# Patient Record
Sex: Female | Born: 1960 | Race: White | Hispanic: No | Marital: Married | Smoking: Former smoker
Health system: Southern US, Community
[De-identification: ages and names within clinical notes are randomized; demographics above are authoritative.]

## PROBLEM LIST (undated history)

## (undated) DIAGNOSIS — F909 Attention-deficit hyperactivity disorder, unspecified type: Secondary | ICD-10-CM

## (undated) DIAGNOSIS — D6851 Activated protein C resistance: Secondary | ICD-10-CM

## (undated) DIAGNOSIS — C801 Malignant (primary) neoplasm, unspecified: Secondary | ICD-10-CM

## (undated) DIAGNOSIS — N393 Stress incontinence (female) (male): Secondary | ICD-10-CM

## (undated) DIAGNOSIS — Z5189 Encounter for other specified aftercare: Secondary | ICD-10-CM

## (undated) DIAGNOSIS — R42 Dizziness and giddiness: Secondary | ICD-10-CM

## (undated) DIAGNOSIS — I82409 Acute embolism and thrombosis of unspecified deep veins of unspecified lower extremity: Secondary | ICD-10-CM

## (undated) DIAGNOSIS — D759 Disease of blood and blood-forming organs, unspecified: Secondary | ICD-10-CM

## (undated) HISTORY — DX: Activated protein C resistance: D68.51

## (undated) HISTORY — DX: Acute embolism and thrombosis of unspecified deep veins of unspecified lower extremity: I82.409

## (undated) HISTORY — DX: Encounter for other specified aftercare: Z51.89

## (undated) HISTORY — DX: Stress incontinence (female) (male): N39.3

## (undated) HISTORY — DX: Dizziness and giddiness: R42

---

## 1989-09-30 HISTORY — PX: TUBAL LIGATION: SHX77

## 1997-07-07 ENCOUNTER — Emergency Department (HOSPITAL_COMMUNITY): Admission: EM | Admit: 1997-07-07 | Discharge: 1997-07-07 | Payer: Self-pay | Admitting: Emergency Medicine

## 1997-07-08 ENCOUNTER — Emergency Department (HOSPITAL_COMMUNITY): Admission: EM | Admit: 1997-07-08 | Discharge: 1997-07-08 | Payer: Self-pay | Admitting: Emergency Medicine

## 1998-08-16 ENCOUNTER — Ambulatory Visit (HOSPITAL_COMMUNITY): Admission: RE | Admit: 1998-08-16 | Discharge: 1998-08-16 | Payer: Self-pay | Admitting: Family Medicine

## 1999-05-21 ENCOUNTER — Other Ambulatory Visit: Admission: RE | Admit: 1999-05-21 | Discharge: 1999-05-21 | Payer: Self-pay | Admitting: Obstetrics and Gynecology

## 1999-06-26 ENCOUNTER — Ambulatory Visit (HOSPITAL_COMMUNITY): Admission: RE | Admit: 1999-06-26 | Discharge: 1999-06-26 | Payer: Self-pay | Admitting: Emergency Medicine

## 1999-07-10 ENCOUNTER — Other Ambulatory Visit: Admission: RE | Admit: 1999-07-10 | Discharge: 1999-07-10 | Payer: Self-pay | Admitting: Obstetrics and Gynecology

## 1999-07-30 ENCOUNTER — Ambulatory Visit (HOSPITAL_COMMUNITY): Admission: RE | Admit: 1999-07-30 | Discharge: 1999-07-30 | Payer: Self-pay | Admitting: Obstetrics and Gynecology

## 2000-09-19 ENCOUNTER — Emergency Department (HOSPITAL_COMMUNITY): Admission: EM | Admit: 2000-09-19 | Discharge: 2000-09-19 | Payer: Self-pay

## 2001-03-10 ENCOUNTER — Ambulatory Visit (HOSPITAL_COMMUNITY): Admission: RE | Admit: 2001-03-10 | Discharge: 2001-03-10 | Payer: Self-pay | Admitting: Emergency Medicine

## 2009-03-09 HISTORY — PX: LAPAROSCOPIC GASTRIC SLEEVE RESECTION: SHX5895

## 2014-03-09 DIAGNOSIS — R638 Other symptoms and signs concerning food and fluid intake: Secondary | ICD-10-CM | POA: Insufficient documentation

## 2014-09-26 ENCOUNTER — Telehealth: Payer: Self-pay | Admitting: Nurse Practitioner

## 2014-09-26 NOTE — Telephone Encounter (Signed)
Dr. 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 She is requesting first available apt and would like to inform patient soon. Schedule reflects possible apt 09/28/14 at 8:30. MD to inform patient of apt with the condition that this is a tentative time and our office will call the patient to confirm.  After discussing with Joylene John, NP, patient scheduled 10/01/14 at 3:30 with Dr. Denman George. RN requested that Dr. Rogue Bussing please send path and pertinent records.  She confirms the above.   No answer at phone number given by Dr. Rogue Bussing; our office to f/u with patient to confirm apt date/time/location.

## 2014-10-01 ENCOUNTER — Ambulatory Visit: Payer: 59 | Attending: Gynecologic Oncology | Admitting: Gynecologic Oncology

## 2014-10-01 ENCOUNTER — Encounter: Payer: Self-pay | Admitting: Gynecologic Oncology

## 2014-10-01 VITALS — BP 118/72 | HR 86 | Temp 98.1°F | Resp 20 | Ht 68.0 in | Wt 257.3 lb

## 2014-10-01 DIAGNOSIS — C541 Malignant neoplasm of endometrium: Secondary | ICD-10-CM | POA: Insufficient documentation

## 2014-10-01 NOTE — Patient Instructions (Signed)
Preparing for your Surgery  Plan for surgery on August 30 or possibly August 23 if cancellation with Dr. Everitt Amber.  Plan for robotic assisted total hysterectomy, bilateral salpingo-oophorectomy, and sentinel lymph node biopsy.  Pre-operative Testing -You will receive a phone call from presurgical testing at [facility] to arrange for a pre-operative testing appointment before your surgery.  This appointment normally occurs one to two weeks before your scheduled surgery.   -Bring your insurance card, copy of an advanced directive if applicable, medication list  -At that visit, you will be asked to sign a consent for a possible blood transfusion in case a transfusion becomes necessary during surgery.  The need for a blood transfusion is rare but having consent is a necessary part of your care.     -You should not be taking blood thinners or aspirin at least ten days prior to surgery unless instructed by your surgeon.  Day Before Surgery at [facility] will be asked to take in only clear liquids the day before surgery.  Examples of clear liquids include broths, jello, and clear juices.  Avoid carbonated beverages.  You may also be advised to perform a Miralax bowel prep or fleets enema the night before your surgery based off of your provider's recommendations.  You will be advised to have nothing to eat or drink after midnight the evening before.    Your role in recovery Your role is to become active as soon as directed by your doctor, while still giving yourself time to heal.  Rest when you feel tired. You will be asked to do the following in order to speed your recovery:  - Cough and breathe deeply. This helps toclear and expand your lungs and can prevent pneumonia. You may be given a spirometer to practice deep breathing. A staff member will show you how to use the spirometer. - Do mild physical activity. Walking or moving your legs help your circulation and body functions  return to normal. A staff member will help you when you try to walk and will provide you with simple exercises. Do not try to get up or walk alone the first time. - Actively manage your pain. Managing your pain lets you move in comfort. We will ask you to rate your pain on a scale of zero to 10. It is your responsibility to tell your doctor or nurse where and how much you hurt so your pain can be treated.  Special Considerations -If you are diabetic, you may be placed on insulin after surgery to have closer control over your blood sugars to promote healing and recovery.  This does not mean that you will be discharged on insulin.  If applicable, your oral antidiabetics will be resumed when you are tolerating a solid diet.  -Your final pathology results from surgery should be available by the Friday after surgery and the results will be relayed to you when available.  Blood Transfusion Information WHAT IS A BLOOD TRANSFUSION? A transfusion is the replacement of blood or some of its parts. Blood is made up of multiple cells which provide different functions.  Red blood cells carry oxygen and are used for blood loss replacement.  White blood cells fight against infection.  Platelets control bleeding.  Plasma helps clot blood.  Other blood products are available for specialized needs, such as hemophilia or other clotting disorders. BEFORE THE TRANSFUSION  Who gives blood for transfusions?   You may be able to donate blood to be used  at a later date on yourself (autologous donation).  Relatives can be asked to donate blood. This is generally not any safer than if you have received blood from a stranger. The same precautions are taken to ensure safety when a relative's blood is donated.  Healthy volunteers who are fully evaluated to make sure their blood is safe. This is blood bank blood. Transfusion therapy is the safest it has ever been in the practice of medicine. Before blood is taken from  a donor, a complete history is taken to make sure that person has no history of diseases nor engages in risky social behavior (examples are intravenous drug use or sexual activity with multiple partners). The donor's travel history is screened to minimize risk of transmitting infections, such as malaria. The donated blood is tested for signs of infectious diseases, such as HIV and hepatitis. The blood is then tested to be sure it is compatible with you in order to minimize the chance of a transfusion reaction. If you or a relative donates blood, this is often done in anticipation of surgery and is not appropriate for emergency situations. It takes many days to process the donated blood. RISKS AND COMPLICATIONS Although transfusion therapy is very safe and saves many lives, the main dangers of transfusion include:   Getting an infectious disease.  Developing a transfusion reaction. This is an allergic reaction to something in the blood you were given. Every precaution is taken to prevent this. The decision to have a blood transfusion has been considered carefully by your caregiver before blood is given. Blood is not given unless the benefits outweigh the risks.

## 2014-10-01 NOTE — Progress Notes (Signed)
Consult Note: Gyn-Onc  Consult was requested by Dr. Rogue Bussing for the evaluation of Emmelyn Schmale 54 y.o. female with grade 1 endometrial cancer  CC:  Chief Complaint  Patient presents with  . endometrial cancer    New consult    Assessment/Plan:  Ms. Tailynn Armetta  is a 54 y.o.  year old with grade 1 endometrial cancer.  A detailed discussion was held with the patient and her family with regard to to her endometrial cancer diagnosis. We discussed the standard management options for uterine cancer which includes surgery followed possibly by adjuvant therapy depending on the results of surgery. The options for surgical management include a hysterectomy and removal of the tubes and ovaries possibly with removal of pelvic and para-aortic lymph nodes. A minimally invasive approach including a robotic hysterectomy or laparoscopic hysterectomy have benefits including shorter hospital stay, recovery time and better wound healing. The alternative approach is an open hysterectomy. The patient has been counseled about these surgical options and the risks of surgery in general including infection, bleeding, damage to surrounding structures (including bowel, bladder, ureters, nerves or vessels), and the postoperative risks of PE/ DVT, and lymphedema. I extensively reviewed the additional risks of robotic hysterectomy including possible need for conversion to open laparotomy.  I discussed positioning during surgery of trendelenberg and risks of minor facial swelling and care we take in preoperative positioning.  After counseling and consideration of her options, she desires to proceed with robotic hysterectomy, BSO and sentinel lymph node biospy. I counseled the patient about the 3% false negative rate of SLN biopsy, however, a procedure that spares lymphadenectomy and minimizes the risk for lymphedema is important in this patient with a history of chronic LE venous insufficiency from prior DVT.  Because of her  factor V leiden mutation she is at increased risk for VTE. She will receive preoperative dosing of lovenox and 28 days of postop lovenox to mitigate this risk, though that is associated with an increased risk of postop bleeding.   She will be seen by anesthesia for preoperative clearance and discussion of postoperative pain management.  She was given the opportunity to ask questions, which were answered to her satisfaction, and she is agreement with the above mentioned plan of care.   HPI: The patient is a 54 year old G3P3 who is seen in consultation at the request of Dr Rogue Bussing for grade 1 endometrial cancer. The patient has been postmenopausal for several years but has begun experiencing intermittent vaginal spotting. She saw Dr Rogue Bussing who performed TV US on 09/24/2014 which revealed a normal size uterus measuring 7 x 5.3 x 4.6 cm with a thickened endometrial stripe measuring 25 mm. The ovaries bilaterally were normal. 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 A Pap smear performed on June 2016 revealed atypical glandular cells.  The patient is overall healthy woman. She most significantly has a history of factor V Leyden mutation and a remote history of a left lower extremity DVT in 1996. She thinks that she might had a prior DVT in 1991 as well. At that time she was treated with Coumadin however has had no further VTE events and is now only taking low-dose aspirin for prophylaxis. 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  She is morbidly obese with a BMI of 39 kg/m.  Her prior abdominal surgeries include 3 prior cesarean sections. She is no family history concerning for GYN malignancy will Lynch syndrome.   Current Meds:  Outpatient Encounter Prescriptions as of  10/01/2014  Medication Sig  . amphetamine-dextroamphetamine (ADDERALL XR) 30 MG 24 hr capsule Take 30 mg by mouth 2 (two) times daily.   Marland Kitchen ibuprofen (ADVIL,MOTRIN) 200 MG tablet Take 200 mg by mouth as needed.  Marland Kitchen aspirin 81 MG tablet Take 81 mg by mouth 2 (two) times daily.  . [DISCONTINUED] methylphenidate 36 MG PO CR tablet    No facility-administered encounter medications on file as of 10/01/2014.    Allergy: No Known Allergies  Social Hx:   History   Social History  . Marital Status: Married    Spouse Name: N/A  . Number of Children: N/A  . Years of Education: N/A   Occupational History  . Not on file.   Social History Main Topics  . Smoking status: Former Smoker    Quit date: 03/10/1995  . Smokeless tobacco: Not on file  . Alcohol Use: No  . Drug Use: No  . Sexual Activity: Not on file   Other Topics Concern  . Not on file   Social History Narrative  . No narrative on file    Past Surgical Hx:  Past Surgical History  Procedure Laterality Date  . Tubal ligation  09/30/1989  . Cesarean section  1987, 1989, 1991    Past Medical Hx:  Past Medical History  Diagnosis Date  . DVT (deep venous thrombosis)   . Factor V Leiden mutation   . Stress incontinence     Past Gynecological History:  C/s x3. AGUS pap in July 2016.  No LMP recorded.  Family Hx:  Family History  Problem Relation Age of Onset  . Diabetes Mother   . Hypertension Mother   . Congestive Heart Failure Mother   . Diabetes Brother   . Heart disease Brother   . Kidney failure Brother   . Cancer Maternal Aunt   . Diabetes Paternal Grandmother     Review of Systems:  Constitutional  Feels well,    ENT Normal appearing ears and nares bilaterally Skin/Breast  No rash, sores, jaundice, itching, dryness Cardiovascular  No chest pain, shortness of breath, or edema  Pulmonary  No cough or wheeze.  Gastro Intestinal  No nausea, vomitting, or diarrhoea. No bright red blood per rectum, no  abdominal pain, change in bowel movement, or constipation.  Genito Urinary  No frequency, urgency, dysuria, see HPI Musculo Skeletal  No myalgia, arthralgia, joint swelling or pain  Neurologic  No weakness, numbness, change in gait,  Psychology  No depression, anxiety, insomnia.   Vitals:  Blood pressure 118/72, pulse 86, temperature 98.1 F (36.7 C), temperature source Oral, resp. rate 20, height 5\' 8"  (1.727 m), weight 257 lb 4.8 oz (116.711 kg), SpO2 100 %.  Physical Exam: WD in NAD Neck  Supple NROM, without any enlargements.  Lymph Node Survey No cervical supraclavicular or inguinal adenopathy Cardiovascular  Pulse normal rate, regularity and rhythm. S1 and S2 normal.  Lungs  Clear to auscultation bilateraly, without wheezes/crackles/rhonchi. Good air movement.  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 Psychiatry  Alert and oriented to person, place, and time  Abdomen  Normoactive bowel sounds, abdomen soft, non-tender and obese without evidence of hernia.  Back No CVA tenderness Genito Urinary  Vulva/vagina: Normal external female genitalia.  No  lesions. No discharge or bleeding.  Bladder/urethra:  No lesions or masses, well supported bladder  Vagina: normal  Cervix: Normal appearing, no lesions.  Uterus:  Small, mobile, no parametrial involvement or nodularity.  Adnexa: no palpable masses. Rectal  Good tone, no masses no cul de sac nodularity.  Extremities  No bilateral cyanosis, clubbing or edema.  Donaciano Eva, MD  10/01/2014, 4:55 PM

## 2014-10-11 ENCOUNTER — Telehealth: Payer: Self-pay | Admitting: Gynecologic Oncology

## 2014-10-11 NOTE — Telephone Encounter (Signed)
Spoke with patient about potential opening for surgery next week and to see if she would like to move her surgery up to next week from August 30.  Stating she is having her floors redone next week and feels that she would have to stay on the 30th or if we get another opening, she would like that date.  Advised she would be contacted if another opening arose.  Advised to call for any questions or concerns.

## 2014-10-19 ENCOUNTER — Telehealth: Payer: Self-pay | Admitting: *Deleted

## 2014-10-19 NOTE — Telephone Encounter (Signed)
Spoke with patient and she states she would like to postpone surgery currently scheduled on 10/23/14 to either 8/23 or 8/30 as her grandson's first birthday party is scheduled for 10/27/14. Told patient that currently there are no open surgery slots for 8/23 so her surgery will be on 8/30 as previously planned. Patient states understanding and has no other concerns at this time.

## 2014-10-29 NOTE — Patient Instructions (Signed)
Peggy Freeman  10/29/2014   Your procedure is scheduled on: Tuesday 11/06/2014  Report to [facility] Main  Entrance take [place]  elevators to 3rd floor to  [place] at  [place] AM.  Call this number if you have problems the morning of surgery [phone]   Remember: ONLY 1 PERSON MAY GO WITH YOU TO SHORT STAY TO GET  READY MORNING OF [place].              FOLLOW A CLEAR LIQUID DIET ALL DAY THE DAY BEFORE SURGERY -ON Monday 11/05/2014! (SEE LIST BELOW)  Do not eat food or drink liquids :After Midnight.     Take these medicines the morning of surgery with A SIP OF WATER: NONE                               You may not have any metal on your body including hair pins and              piercings  Do not wear jewelry, make-up, lotions, powders or perfumes, deodorant             Do not wear nail polish.  Do not shave  48 hours prior to surgery.              Men may shave face and neck.   Do not bring valuables to the hospital. [place].  Contacts, dentures or bridgework may not be worn into surgery.  Leave suitcase in the car. After surgery it may be brought to your room.     Patients discharged the day of surgery will not be allowed to drive home.  Name and phone number of your driver:  Special Instructions: N/A              Please read over the following fact sheets you were given: _____________________________________________________________________             [facility] - Preparing for Surgery Before surgery, you can play an important role.  Because skin is not sterile, your skin needs to be as free of germs as possible.  You can reduce the number of germs on your skin by washing with CHG (chlorahexidine gluconate) soap before surgery.  CHG is an antiseptic cleaner which kills germs and bonds with the skin to continue killing germs even after washing. Please DO NOT use if you have an allergy to CHG  or antibacterial soaps.  If your skin becomes reddened/irritated stop using the CHG and inform your nurse when you arrive at Short Stay. Do not shave (including legs and underarms) for at least 48 hours prior to the first CHG shower.  You may shave your face/neck. Please follow these instructions carefully:  1.  Shower with CHG Soap the night before surgery and the  morning of Surgery.  2.  If you choose to wash your hair, wash your hair first as usual with your  normal  shampoo.  3.  After you shampoo, rinse your hair and body thoroughly to remove the  shampoo.                           4.  Use CHG as you  would any other liquid soap.  You can apply chg directly  to the skin and wash                       Gently with a scrungie or clean washcloth.  5.  Apply the CHG Soap to your body ONLY FROM THE NECK DOWN.   Do not use on face/ open                           Wound or open sores. Avoid contact with eyes, ears mouth and genitals (private parts).                       Wash face,  Genitals (private parts) with your normal soap.             6.  Wash thoroughly, paying special attention to the area where your surgery  will be performed.  7.  Thoroughly rinse your body with warm water from the neck down.  8.  DO NOT shower/wash with your normal soap after using and rinsing off  the CHG Soap.                9.  Pat yourself dry with a clean towel.            10.  Wear clean pajamas.            11.  Place clean sheets on your bed the night of your first shower and do not  sleep with pets. Day of Surgery : Do not apply any lotions/deodorants the morning of surgery.  Please wear clean clothes to the hospital/surgery center.  FAILURE TO FOLLOW THESE INSTRUCTIONS MAY RESULT IN THE CANCELLATION OF YOUR SURGERY PATIENT SIGNATURE_________________________________  NURSE SIGNATURE__________________________________  ________________________________________________________________________   Adam Phenix  An incentive spirometer is a tool that can help keep your lungs clear and active. This tool measures how well you are filling your lungs with each breath. Taking long deep breaths may help reverse or decrease the chance of developing breathing (pulmonary) problems (especially infection) following:  A long period of time when you are unable to move or be active. BEFORE THE PROCEDURE   If the spirometer includes an indicator to show your best effort, your nurse or respiratory therapist will set it to a desired goal.  If possible, sit up straight or lean slightly forward. Try not to slouch.  Hold the incentive spirometer in an upright position. INSTRUCTIONS FOR USE   Sit on the edge of your bed if possible, or sit up as far as you can in bed or on a chair.  Hold the incentive spirometer in an upright position.  Breathe out normally.  Place the mouthpiece in your mouth and seal your lips tightly around it.  Breathe in slowly and as deeply as possible, raising the piston or the ball toward the top of the column.  Hold your breath for 3-5 seconds or for as long as possible. Allow the piston or ball to fall to the bottom of the column.  Remove the mouthpiece from your mouth and breathe out normally.  Rest for a few seconds and repeat Steps 1 through 7 at least 10 times every 1-2 hours when you are awake. Take your time and take a few normal breaths between deep breaths.  The spirometer may include an indicator to show your best effort. Use the indicator  as a goal to work toward during each repetition.  After each set of 10 deep breaths, practice coughing to be sure your lungs are clear. If you have an incision (the cut made at the time of surgery), support your incision when coughing by placing a pillow or rolled up towels firmly against it. Once you are able to get out of bed, walk around indoors and cough well. You may stop using the incentive spirometer when instructed by  your caregiver.  RISKS AND COMPLICATIONS  Take your time so you do not get dizzy or light-headed.  If you are in pain, you may need to take or ask for pain medication before doing incentive spirometry. It is harder to take a deep breath if you are having pain. AFTER USE  Rest and breathe slowly and easily.  It can be helpful to keep track of a log of your progress. Your caregiver can provide you with a simple table to help with this. If you are using the spirometer at home, follow these instructions: [place] IF:   You are having difficultly using the spirometer.  You have trouble using the spirometer as often as instructed.  Your pain medication is not giving enough relief while using the spirometer.  You develop fever of 100.5 F (38.1 C) or higher. SEEK IMMEDIATE MEDICAL CARE IF:   You cough up bloody sputum that had not been present before.  You develop fever of 102 F (38.9 C) or greater.  You develop worsening pain at or near the incision site. MAKE SURE YOU:   Understand these instructions.  Will watch your condition.  Will get help right away if you are not doing well or get worse. Document Released: 07/06/2006 Document Revised: 05/18/2011 Document Reviewed: 09/06/2006 ExitCare Patient Information 2014 ExitCare, [place].   ________________________________________________________________________  WHAT IS A BLOOD TRANSFUSION? Blood Transfusion Information  A transfusion is the replacement of blood or some of its parts. Blood is made up of multiple cells which provide different functions.  Red blood cells carry oxygen and are used for blood loss replacement.  White blood cells fight against infection.  Platelets control bleeding.  Plasma helps clot blood.  Other blood products are available for specialized needs, such as hemophilia or other clotting disorders. BEFORE THE TRANSFUSION  Who gives blood for transfusions?   Healthy volunteers who are  fully evaluated to make sure their blood is safe. This is blood bank blood. Transfusion therapy is the safest it has ever been in the practice of medicine. Before blood is taken from a donor, a complete history is taken to make sure that person has no history of diseases nor engages in risky social behavior (examples are intravenous drug use or sexual activity with multiple partners). The donor's travel history is screened to minimize risk of transmitting infections, such as malaria. The donated blood is tested for signs of infectious diseases, such as HIV and hepatitis. The blood is then tested to be sure it is compatible with you in order to minimize the chance of a transfusion reaction. If you or a relative donates blood, this is often done in anticipation of surgery and is not appropriate for emergency situations. It takes many days to process the donated blood. RISKS AND COMPLICATIONS Although transfusion therapy is very safe and saves many lives, the main dangers of transfusion include:   Getting an infectious disease.  Developing a transfusion reaction. This is an allergic reaction to something in the blood you were  given. Every precaution is taken to prevent this. The decision to have a blood transfusion has been considered carefully by your caregiver before blood is given. Blood is not given unless the benefits outweigh the risks. AFTER THE TRANSFUSION  Right after receiving a blood transfusion, you will usually feel much better and more energetic. This is especially true if your red blood cells have gotten low (anemic). The transfusion raises the level of the red blood cells which carry oxygen, and this usually causes an energy increase.  The nurse administering the transfusion will monitor you carefully for complications. HOME CARE INSTRUCTIONS  No special instructions are needed after a transfusion. You may find your energy is better. Speak with your caregiver about any limitations on  activity for underlying diseases you may have. SEEK MEDICAL CARE IF:   Your condition is not improving after your transfusion.  You develop redness or irritation at the intravenous (IV) site. SEEK IMMEDIATE MEDICAL CARE IF:  Any of the following symptoms occur over the next 12 hours:  Shaking chills.  You have a temperature by mouth above 102 F (38.9 C), not controlled by medicine.  Chest, back, or muscle pain.  People around you feel you are not acting correctly or are confused.  Shortness of breath or difficulty breathing.  Dizziness and fainting.  You get a rash or develop hives.  You have a decrease in urine output.  Your urine turns a dark color or changes to pink, red, or brown. Any of the following symptoms occur over the next 10 days:  You have a temperature by mouth above 102 F (38.9 C), not controlled by medicine.  Shortness of breath.  Weakness after normal activity.  The white part of the eye turns yellow (jaundice).  You have a decrease in the amount of urine or are urinating less often.  Your urine turns a dark color or changes to pink, red, or brown. Document Released: 02/21/2000 Document Revised: 05/18/2011 Document Reviewed: 10/10/2007 ExitCare Patient Information 2014 ExitCare, [place].  _______________________________________________________________________   CLEAR LIQUID DIET   Foods Allowed                                                                     Foods Excluded  Coffee and tea, regular and decaf                             liquids that you cannot  Plain Jell-O in any flavor                                             see through such as: Fruit ices (not with fruit pulp)                                     milk, soups, orange juice  Iced Popsicles  All solid food Carbonated beverages, regular and diet                                    Cranberry, grape and apple juices Sports drinks like  Gatorade Lightly seasoned clear broth or consume(fat free) Sugar, honey syrup  Sample Menu Breakfast                                Lunch                                     Supper Cranberry juice                    Beef broth                            Chicken broth Jell-O                                     Grape juice                           Apple juice Coffee or tea                        Jell-O                                      Popsicle                                                Coffee or tea                        Coffee or tea  _____________________________________________________________________

## 2014-10-30 ENCOUNTER — Ambulatory Visit (HOSPITAL_COMMUNITY)
Admission: RE | Admit: 2014-10-30 | Discharge: 2014-10-30 | Disposition: A | Discharge status: Home / Self Care | Payer: 59 | Source: Ambulatory Visit | Attending: Gynecologic Oncology | Admitting: Gynecologic Oncology

## 2014-10-30 ENCOUNTER — Encounter (HOSPITAL_COMMUNITY): Payer: Self-pay

## 2014-10-30 ENCOUNTER — Encounter (HOSPITAL_COMMUNITY)
Admission: RE | Admit: 2014-10-30 | Discharge: 2014-10-30 | Disposition: A | Discharge status: Home / Self Care | Payer: 59 | Source: Ambulatory Visit | Attending: Gynecologic Oncology | Admitting: Gynecologic Oncology

## 2014-10-30 DIAGNOSIS — Z87891 Personal history of nicotine dependence: Secondary | ICD-10-CM | POA: Insufficient documentation

## 2014-10-30 DIAGNOSIS — Z01818 Encounter for other preprocedural examination: Secondary | ICD-10-CM | POA: Insufficient documentation

## 2014-10-30 DIAGNOSIS — C541 Malignant neoplasm of endometrium: Secondary | ICD-10-CM | POA: Insufficient documentation

## 2014-10-30 DIAGNOSIS — Z0183 Encounter for blood typing: Secondary | ICD-10-CM | POA: Diagnosis not present

## 2014-10-30 DIAGNOSIS — M5134 Other intervertebral disc degeneration, thoracic region: Secondary | ICD-10-CM | POA: Insufficient documentation

## 2014-10-30 DIAGNOSIS — Z86718 Personal history of other venous thrombosis and embolism: Secondary | ICD-10-CM | POA: Insufficient documentation

## 2014-10-30 DIAGNOSIS — Z01812 Encounter for preprocedural laboratory examination: Secondary | ICD-10-CM | POA: Insufficient documentation

## 2014-10-30 HISTORY — DX: Attention-deficit hyperactivity disorder, unspecified type: F90.9

## 2014-10-30 HISTORY — DX: Disease of blood and blood-forming organs, unspecified: D75.9

## 2014-10-30 HISTORY — DX: Malignant (primary) neoplasm, unspecified: C80.1

## 2014-10-30 LAB — COMPREHENSIVE METABOLIC PANEL
ALK PHOS: 51 U/L (ref 38–126)
ALT: 19 U/L (ref 14–54)
AST: 22 U/L (ref 15–41)
Albumin: 3.9 g/dL (ref 3.5–5.0)
Anion gap: 5 (ref 5–15)
BILIRUBIN TOTAL: 0.3 mg/dL (ref 0.3–1.2)
BUN: 21 mg/dL — ABNORMAL HIGH (ref 6–20)
CALCIUM: 9.3 mg/dL (ref 8.9–10.3)
CO2: 29 mmol/L (ref 22–32)
Chloride: 105 mmol/L (ref 101–111)
Creatinine, Ser: 1.07 mg/dL — ABNORMAL HIGH (ref 0.44–1.00)
GFR calc Af Amer: >60 mL/min (ref >60)
GFR calc non Af Amer: 58 mL/min — ABNORMAL LOW (ref >60)
GLUCOSE: 104 mg/dL — AB (ref 65–99)
Potassium: 4.7 mmol/L (ref 3.5–5.1)
Sodium: 139 mmol/L (ref 135–145)
TOTAL PROTEIN: 7.2 g/dL (ref 6.5–8.1)

## 2014-10-30 LAB — URINALYSIS, ROUTINE W REFLEX MICROSCOPIC
Bilirubin Urine: NEGATIVE
GLUCOSE, UA: NEGATIVE mg/dL
KETONES UR: NEGATIVE mg/dL
Nitrite: NEGATIVE
PH: 7.5 (ref 5.0–8.0)
PROTEIN: NEGATIVE mg/dL
Specific Gravity, Urine: 1.015 (ref 1.005–1.030)
Urobilinogen, UA: 0.2 mg/dL (ref 0.0–1.0)

## 2014-10-30 LAB — ABO/RH: ABO/RH(D): A POS

## 2014-10-30 LAB — CBC WITH DIFFERENTIAL/PLATELET
Basophils Absolute: 0.1 10*3/uL (ref 0.0–0.1)
Basophils Relative: 2 % — ABNORMAL HIGH (ref 0–1)
Eosinophils Absolute: 0.2 10*3/uL (ref 0.0–0.7)
Eosinophils Relative: 3 % (ref 0–5)
HEMATOCRIT: 41.6 % (ref 36.0–46.0)
HEMOGLOBIN: 13.6 g/dL (ref 12.0–15.0)
LYMPHS ABS: 1.4 10*3/uL (ref 0.7–4.0)
LYMPHS PCT: 27 % (ref 12–46)
MCH: 29.9 pg (ref 26.0–34.0)
MCHC: 32.7 g/dL (ref 30.0–36.0)
MCV: 91.4 fL (ref 78.0–100.0)
MONOS PCT: 9 % (ref 3–12)
Monocytes Absolute: 0.5 10*3/uL (ref 0.1–1.0)
NEUTROS PCT: 59 % (ref 43–77)
Neutro Abs: 3.1 10*3/uL (ref 1.7–7.7)
Platelets: 249 10*3/uL (ref 150–400)
RBC: 4.55 MIL/uL (ref 3.87–5.11)
RDW: 14.5 % (ref 11.5–15.5)
WBC: 5.3 10*3/uL (ref 4.0–10.5)

## 2014-10-30 LAB — URINE MICROSCOPIC-ADD ON

## 2014-10-30 LAB — HCG, SERUM, QUALITATIVE: Preg, Serum: NEGATIVE

## 2014-10-30 NOTE — Anesthesia Preprocedure Evaluation (Addendum)
Anesthesia Evaluation  Patient identified by MRN, date of birth, ID band Patient awake    Reviewed: Allergy & Precautions, NPO status , Patient's Chart, lab work & pertinent test results  History of Anesthesia Complications (+) history of anesthetic complications  Airway Mallampati: I  TM Distance: >3 FB Neck ROM: Full    Dental  (+) Teeth Intact, Dental Advisory Given   Pulmonary pneumonia -, former smoker,  breath sounds clear to auscultation  Pulmonary exam normal       Cardiovascular Exercise Tolerance: Good - anginaDVT - Past MI Normal cardiovascular examRhythm:Regular Rate:Normal  History of LE DVT in 1996.  Patient also thinks she may have had another DVT in 1991 but is unsure.  Was on warfarin therapy but discontinued.  Currently takes ASA 81mg  daily and has had no further DVTs since 1996.   Neuro/Psych PSYCHIATRIC DISORDERS ADHD on adderallnegative neurological ROS     GI/Hepatic negative GI ROS, Neg liver ROS,   Endo/Other  Morbid obesity  Renal/GU negative Renal ROS   Endometrial Cancer    Musculoskeletal negative musculoskeletal ROS (+)   Abdominal (+) + obese,   Peds  Hematology  (+) Blood dyscrasia, , Factor V Leiden--ASA therapy   Anesthesia Other Findings Day of surgery medications reviewed with the patient.  Reproductive/Obstetrics                         Anesthesia Physical Anesthesia Plan  ASA: III  Anesthesia Plan: General   Post-op Pain Management:    Induction: Intravenous  Airway Management Planned: Oral ETT  Additional Equipment:   Intra-op Plan:   Post-operative Plan: Extubation in OR  Informed Consent: I have reviewed the patients History and Physical, chart, labs and discussed the procedure including the risks, benefits and alternatives for the proposed anesthesia with the patient or authorized representative who has indicated his/her understanding and  acceptance.   Dental advisory given  Plan Discussed with: CRNA  Anesthesia Plan Comments: (GA with ETT, multitherapy for antinausea  Factor V Leiden: surgeon has ordered preoperative Lovenox injection.)       Anesthesia Quick Evaluation

## 2014-10-30 NOTE — Progress Notes (Signed)
09/28/2014-office note from Dr. Christianne Borrow on chart.

## 2014-11-05 MED ORDER — ENOXAPARIN SODIUM 40 MG/0.4ML ~~LOC~~ SOLN
40.0000 mg | SUBCUTANEOUS | Status: AC
  Administered 2014-11-06: 40 mg via SUBCUTANEOUS
  Filled 2014-11-05 (×2): qty 0.4

## 2014-11-06 ENCOUNTER — Encounter (HOSPITAL_COMMUNITY)
Admission: RE | Disposition: A | Discharge status: Home / Self Care | Payer: Self-pay | Source: Ambulatory Visit | Attending: Gynecologic Oncology

## 2014-11-06 ENCOUNTER — Ambulatory Visit (HOSPITAL_COMMUNITY): Payer: 59 | Admitting: Anesthesiology

## 2014-11-06 ENCOUNTER — Encounter (HOSPITAL_COMMUNITY): Payer: Self-pay | Admitting: *Deleted

## 2014-11-06 ENCOUNTER — Ambulatory Visit (HOSPITAL_COMMUNITY)
Admission: RE | Admit: 2014-11-06 | Discharge: 2014-11-07 | Disposition: A | Discharge status: Home / Self Care | Payer: 59 | Source: Ambulatory Visit | Attending: Gynecologic Oncology | Admitting: Gynecologic Oncology

## 2014-11-06 DIAGNOSIS — Z6839 Body mass index (BMI) 39.0-39.9, adult: Secondary | ICD-10-CM | POA: Diagnosis not present

## 2014-11-06 DIAGNOSIS — Z791 Long term (current) use of non-steroidal anti-inflammatories (NSAID): Secondary | ICD-10-CM | POA: Insufficient documentation

## 2014-11-06 DIAGNOSIS — R600 Localized edema: Secondary | ICD-10-CM | POA: Insufficient documentation

## 2014-11-06 DIAGNOSIS — Z86718 Personal history of other venous thrombosis and embolism: Secondary | ICD-10-CM | POA: Diagnosis not present

## 2014-11-06 DIAGNOSIS — C541 Malignant neoplasm of endometrium: Secondary | ICD-10-CM | POA: Diagnosis not present

## 2014-11-06 DIAGNOSIS — Z7982 Long term (current) use of aspirin: Secondary | ICD-10-CM | POA: Diagnosis not present

## 2014-11-06 DIAGNOSIS — I252 Old myocardial infarction: Secondary | ICD-10-CM | POA: Diagnosis not present

## 2014-11-06 DIAGNOSIS — N736 Female pelvic peritoneal adhesions (postinfective): Secondary | ICD-10-CM | POA: Diagnosis not present

## 2014-11-06 DIAGNOSIS — Z79899 Other long term (current) drug therapy: Secondary | ICD-10-CM | POA: Diagnosis not present

## 2014-11-06 DIAGNOSIS — Z87891 Personal history of nicotine dependence: Secondary | ICD-10-CM | POA: Insufficient documentation

## 2014-11-06 DIAGNOSIS — D6851 Activated protein C resistance: Secondary | ICD-10-CM | POA: Insufficient documentation

## 2014-11-06 DIAGNOSIS — F909 Attention-deficit hyperactivity disorder, unspecified type: Secondary | ICD-10-CM | POA: Insufficient documentation

## 2014-11-06 HISTORY — PX: ROBOTIC ASSISTED TOTAL HYSTERECTOMY WITH BILATERAL SALPINGO OOPHERECTOMY: SHX6086

## 2014-11-06 LAB — TYPE AND SCREEN
ABO/RH(D): A POS
Antibody Screen: NEGATIVE

## 2014-11-06 SURGERY — ROBOTIC ASSISTED TOTAL HYSTERECTOMY WITH BILATERAL SALPINGO OOPHORECTOMY
Anesthesia: General | Laterality: Bilateral

## 2014-11-06 MED ORDER — NEOSTIGMINE METHYLSULFATE 10 MG/10ML IV SOLN
INTRAVENOUS | Status: AC
  Filled 2014-11-06: qty 1

## 2014-11-06 MED ORDER — HYDROMORPHONE HCL 1 MG/ML IJ SOLN
0.2500 mg | INTRAMUSCULAR | Status: DC | PRN
  Administered 2014-11-06 (×2): 0.5 mg via INTRAVENOUS

## 2014-11-06 MED ORDER — LACTATED RINGERS IV SOLN
INTRAVENOUS | Status: DC
  Administered 2014-11-06: 1000 mL via INTRAVENOUS

## 2014-11-06 MED ORDER — LACTATED RINGERS IV SOLN
INTRAVENOUS | Status: DC | PRN
  Administered 2014-11-06 (×2): via INTRAVENOUS

## 2014-11-06 MED ORDER — STERILE WATER FOR IRRIGATION IR SOLN
Status: DC | PRN
  Administered 2014-11-06: 1000 mL

## 2014-11-06 MED ORDER — MIDAZOLAM HCL 5 MG/5ML IJ SOLN
INTRAMUSCULAR | Status: DC | PRN
  Administered 2014-11-06 (×2): 1 mg via INTRAVENOUS

## 2014-11-06 MED ORDER — KCL IN DEXTROSE-NACL 20-5-0.45 MEQ/L-%-% IV SOLN
INTRAVENOUS | Status: DC
  Administered 2014-11-06: 16:00:00 via INTRAVENOUS
  Filled 2014-11-06 (×2): qty 1000

## 2014-11-06 MED ORDER — ENOXAPARIN SODIUM 40 MG/0.4ML ~~LOC~~ SOLN
40.0000 mg | SUBCUTANEOUS | Status: DC
  Administered 2014-11-07: 40 mg via SUBCUTANEOUS
  Filled 2014-11-06 (×2): qty 0.4

## 2014-11-06 MED ORDER — ONDANSETRON HCL 4 MG PO TABS: 4.0000 mg | ORAL_TABLET | Freq: Four times a day (QID) | ORAL | Status: DC | PRN

## 2014-11-06 MED ORDER — PROPOFOL 10 MG/ML IV BOLUS
INTRAVENOUS | Status: AC
  Filled 2014-11-06: qty 20

## 2014-11-06 MED ORDER — GLYCOPYRROLATE 0.2 MG/ML IJ SOLN
INTRAMUSCULAR | Status: AC
  Filled 2014-11-06: qty 3

## 2014-11-06 MED ORDER — LACTATED RINGERS IR SOLN
Status: DC | PRN
  Administered 2014-11-06: 1000 mL

## 2014-11-06 MED ORDER — ROCURONIUM BROMIDE 100 MG/10ML IV SOLN
INTRAVENOUS | Status: DC | PRN
  Administered 2014-11-06 (×2): 20 mg via INTRAVENOUS
  Administered 2014-11-06: 50 mg via INTRAVENOUS

## 2014-11-06 MED ORDER — DEXAMETHASONE SODIUM PHOSPHATE 10 MG/ML IJ SOLN
INTRAMUSCULAR | Status: DC | PRN
  Administered 2014-11-06: 10 mg via INTRAVENOUS

## 2014-11-06 MED ORDER — PROPOFOL 10 MG/ML IV BOLUS
INTRAVENOUS | Status: DC | PRN
  Administered 2014-11-06: 170 mg via INTRAVENOUS

## 2014-11-06 MED ORDER — GLYCOPYRROLATE 0.2 MG/ML IJ SOLN
INTRAMUSCULAR | Status: DC | PRN
  Administered 2014-11-06: 0.6 mg via INTRAVENOUS

## 2014-11-06 MED ORDER — CEFAZOLIN SODIUM-DEXTROSE 2-3 GM-% IV SOLR
2.0000 g | INTRAVENOUS | Status: AC
  Administered 2014-11-06: 2 g via INTRAVENOUS

## 2014-11-06 MED ORDER — ASPIRIN EC 81 MG PO TBEC
81.0000 mg | DELAYED_RELEASE_TABLET | Freq: Every day | ORAL | Status: DC
  Administered 2014-11-06: 81 mg via ORAL
  Filled 2014-11-06 (×3): qty 1

## 2014-11-06 MED ORDER — METOCLOPRAMIDE HCL 5 MG/ML IJ SOLN
INTRAMUSCULAR | Status: DC | PRN
  Administered 2014-11-06: 10 mg via INTRAVENOUS

## 2014-11-06 MED ORDER — LIDOCAINE HCL (CARDIAC) 20 MG/ML IV SOLN
INTRAVENOUS | Status: DC | PRN
  Administered 2014-11-06: 60 mg via INTRAVENOUS

## 2014-11-06 MED ORDER — MIDAZOLAM HCL 2 MG/2ML IJ SOLN
INTRAMUSCULAR | Status: AC
  Filled 2014-11-06: qty 4

## 2014-11-06 MED ORDER — PROMETHAZINE HCL 25 MG/ML IJ SOLN: 6.2500 mg | INTRAMUSCULAR | Status: DC | PRN

## 2014-11-06 MED ORDER — ONDANSETRON HCL 4 MG/2ML IJ SOLN
INTRAMUSCULAR | Status: DC | PRN
Start: 2014-11-06 — End: 2014-11-06
  Administered 2014-11-06: 4 mg via INTRAVENOUS

## 2014-11-06 MED ORDER — ONDANSETRON HCL 4 MG/2ML IJ SOLN: 4.0000 mg | Freq: Four times a day (QID) | INTRAMUSCULAR | Status: DC | PRN

## 2014-11-06 MED ORDER — PHENYLEPHRINE 40 MCG/ML (10ML) SYRINGE FOR IV PUSH (FOR BLOOD PRESSURE SUPPORT)
PREFILLED_SYRINGE | INTRAVENOUS | Status: AC
  Filled 2014-11-06: qty 10

## 2014-11-06 MED ORDER — PHENYLEPHRINE HCL 10 MG/ML IJ SOLN
INTRAMUSCULAR | Status: DC | PRN
  Administered 2014-11-06: 80 ug via INTRAVENOUS

## 2014-11-06 MED ORDER — HYDROMORPHONE HCL 1 MG/ML IJ SOLN
INTRAMUSCULAR | Status: AC
  Filled 2014-11-06: qty 1

## 2014-11-06 MED ORDER — SUFENTANIL CITRATE 50 MCG/ML IV SOLN
INTRAVENOUS | Status: DC | PRN
  Administered 2014-11-06 (×2): 7.5 ug via INTRAVENOUS
  Administered 2014-11-06 (×4): 5 ug via INTRAVENOUS

## 2014-11-06 MED ORDER — SUFENTANIL CITRATE 50 MCG/ML IV SOLN
INTRAVENOUS | Status: AC
Start: 2014-11-06 — End: 2014-11-06
  Filled 2014-11-06: qty 1

## 2014-11-06 MED ORDER — NEOSTIGMINE METHYLSULFATE 10 MG/10ML IV SOLN
INTRAVENOUS | Status: DC | PRN
  Administered 2014-11-06: 4 mg via INTRAVENOUS

## 2014-11-06 MED ORDER — ONDANSETRON HCL 4 MG/2ML IJ SOLN
INTRAMUSCULAR | Status: AC
  Filled 2014-11-06: qty 2

## 2014-11-06 MED ORDER — LIDOCAINE HCL (CARDIAC) 20 MG/ML IV SOLN
INTRAVENOUS | Status: AC
  Filled 2014-11-06: qty 5

## 2014-11-06 MED ORDER — OXYCODONE HCL 5 MG/5ML PO SOLN: 5.0000 mg | Freq: Once | ORAL | Status: DC | PRN

## 2014-11-06 MED ORDER — KETOROLAC TROMETHAMINE 15 MG/ML IJ SOLN: 15.0000 mg | Freq: Four times a day (QID) | INTRAMUSCULAR | Status: AC | PRN

## 2014-11-06 MED ORDER — IBUPROFEN 800 MG PO TABS: 800.0000 mg | ORAL_TABLET | Freq: Three times a day (TID) | ORAL | Status: DC | PRN

## 2014-11-06 MED ORDER — ROCURONIUM BROMIDE 100 MG/10ML IV SOLN
INTRAVENOUS | Status: AC
  Filled 2014-11-06: qty 1

## 2014-11-06 MED ORDER — OXYCODONE HCL 5 MG PO TABS: 5.0000 mg | ORAL_TABLET | Freq: Once | ORAL | Status: DC | PRN

## 2014-11-06 MED ORDER — CEFAZOLIN SODIUM-DEXTROSE 2-3 GM-% IV SOLR
INTRAVENOUS | Status: AC
  Filled 2014-11-06: qty 50

## 2014-11-06 MED ORDER — HYDROMORPHONE HCL 1 MG/ML IJ SOLN: 0.2000 mg | INTRAMUSCULAR | Status: DC | PRN

## 2014-11-06 MED ORDER — OXYCODONE-ACETAMINOPHEN 5-325 MG PO TABS
1.0000 | ORAL_TABLET | ORAL | Status: DC | PRN
  Administered 2014-11-06: 2 via ORAL
  Filled 2014-11-06: qty 2

## 2014-11-06 SURGICAL SUPPLY — 52 items
CABLE HIGH FREQUENCY MONO STRZ (ELECTRODE) ×2 IMPLANT
CHLORAPREP W/TINT 26ML (MISCELLANEOUS) ×2 IMPLANT
CORDS BIPOLAR (ELECTRODE) ×2 IMPLANT
COVER SURGICAL LIGHT HANDLE (MISCELLANEOUS) ×2 IMPLANT
COVER TIP SHEARS 8 DVNC (MISCELLANEOUS) ×1 IMPLANT
COVER TIP SHEARS 8MM DA VINCI (MISCELLANEOUS) ×1
DRAPE SHEET LG 3/4 BI-LAMINATE (DRAPES) ×4 IMPLANT
DRAPE SURG IRRIG POUCH 19X23 (DRAPES) ×2 IMPLANT
DRAPE TABLE BACK 44X90 PK DISP (DRAPES) IMPLANT
DRAPE WARM FLUID 44X44 (DRAPE) ×2 IMPLANT
DRSG TEGADERM 6X8 (GAUZE/BANDAGES/DRESSINGS) IMPLANT
ELECT REM PT RETURN 9FT ADLT (ELECTROSURGICAL) ×2
ELECTRODE REM PT RTRN 9FT ADLT (ELECTROSURGICAL) ×1 IMPLANT
GLOVE BIO SURGEON STRL SZ 6 (GLOVE) ×6 IMPLANT
GLOVE BIO SURGEON STRL SZ 6.5 (GLOVE) ×4 IMPLANT
GOWN STRL REUS W/ TWL LRG LVL3 (GOWN DISPOSABLE) ×3 IMPLANT
GOWN STRL REUS W/TWL LRG LVL3 (GOWN DISPOSABLE) ×3
HOLDER FOLEY CATH W/STRAP (MISCELLANEOUS) ×2 IMPLANT
KIT ACCESSORY DA VINCI DISP (KITS) ×1
KIT ACCESSORY DVNC DISP (KITS) ×1 IMPLANT
KIT BASIN OR (CUSTOM PROCEDURE TRAY) ×2 IMPLANT
KIT PROCEDURE DA VINCI SI (MISCELLANEOUS)
KIT PROCEDURE DVNC SI (MISCELLANEOUS) IMPLANT
LIQUID BAND (GAUZE/BANDAGES/DRESSINGS) ×2 IMPLANT
MANIPULATOR UTERINE 4.5 ZUMI (MISCELLANEOUS) ×2 IMPLANT
NDL SAFETY ECLIPSE 18X1.5 (NEEDLE) IMPLANT
NEEDLE HYPO 18GX1.5 SHARP (NEEDLE)
NEEDLE SPNL 18GX3.5 QUINCKE PK (NEEDLE) IMPLANT
OCCLUDER COLPOPNEUMO (BALLOONS) ×2 IMPLANT
PEN SKIN MARKING BROAD (MISCELLANEOUS) ×2 IMPLANT
PORT ACCESS TROCAR AIRSEAL 12 (TROCAR) ×1 IMPLANT
PORT ACCESS TROCAR AIRSEAL 5M (TROCAR) ×1
POUCH SPECIMEN RETRIEVAL 10MM (ENDOMECHANICALS) IMPLANT
SET TRI-LUMEN FLTR TB AIRSEAL (TUBING) ×2 IMPLANT
SET TUBE IRRIG SUCTION NO TIP (IRRIGATION / IRRIGATOR) ×2 IMPLANT
SHEET LAVH (DRAPES) ×2 IMPLANT
SOLUTION ELECTROLUBE (MISCELLANEOUS) ×2 IMPLANT
SUT VIC AB 0 CT1 27 (SUTURE) ×1
SUT VIC AB 0 CT1 27XBRD ANTBC (SUTURE) ×1 IMPLANT
SUT VIC AB 4-0 PS2 27 (SUTURE) ×4 IMPLANT
SYR 50ML LL SCALE MARK (SYRINGE) ×2 IMPLANT
SYRINGE 10CC LL (SYRINGE) IMPLANT
TOWEL OR 17X26 10 PK STRL BLUE (TOWEL DISPOSABLE) ×2 IMPLANT
TOWEL OR NON WOVEN STRL DISP B (DISPOSABLE) ×2 IMPLANT
TRAP SPECIMEN MUCOUS 40CC (MISCELLANEOUS) ×2 IMPLANT
TRAY FOLEY W/METER SILVER 14FR (SET/KITS/TRAYS/PACK) ×2 IMPLANT
TRAY FOLEY W/METER SILVER 16FR (SET/KITS/TRAYS/PACK) IMPLANT
TRAY LAPAROSCOPIC (CUSTOM PROCEDURE TRAY) ×2 IMPLANT
TROCAR 12M 150ML BLUNT (TROCAR) ×2 IMPLANT
TROCAR BLADELESS OPT 5 100 (ENDOMECHANICALS) ×2 IMPLANT
TROCAR PORT AIRSEAL 5X120 (TROCAR) IMPLANT
WATER STERILE IRR 1500ML POUR (IV SOLUTION) IMPLANT

## 2014-11-06 NOTE — Op Note (Signed)
OPERATIVE NOTE 11/06/14  Surgeon: Donaciano Eva   Assistants: Dr Lahoma Crocker (an MD assistant was necessary for tissue manipulation, management of robotic instrumentation, retraction and positioning due to the complexity of the case and hospital policies).   Anesthesia: General endotracheal anesthesia  ASA Class: 3   Pre-operative Diagnosis: grade 1 endometrial cancer  Post-operative Diagnosis: same  Operation: Robotic-assisted laparoscopic hysterectomy with bilateral salpingoophorectomy, sentinel lymph node biopsy, lysis of adhesions  Surgeon: Donaciano Eva  Assistant Surgeon: Lahoma Crocker MD  Anesthesia: GET  Urine Output: 300  Operative Findings: dense adhesions between uterus and anterior abdominal wall and left pelvic side wall obliterating the left paravesical space. No gross extrauterine disease, normal appearing lymph nodes.  Estimated Blood Loss:  less than 100 mL      Total IV Fluids: 1,000 ml         Specimens: washings, right obturator SLN, right external iliac SLN, left common iliac SLN, uterus with cervix and bilateral tubes and ovaries.         Complications:  None; patient tolerated the procedure well.         Disposition: PACU - hemodynamically stable.  Procedure Details  The patient was seen in the Holding Room. The risks, benefits, complications, treatment options, and expected outcomes were discussed with the patient.  The patient concurred with the proposed plan, giving informed consent.  The site of surgery properly noted/marked. The patient was identified as Peggy Freeman and the procedure verified as a Robotic-assisted hysterectomy with bilateral salpingo oophorectomy. A Time Out was held and the above information confirmed.  After induction of anesthesia, the patient was draped and prepped in the usual sterile manner. Pt was placed in supine position after anesthesia and draped and prepped in the usual sterile manner. The  abdominal drape was placed after the CholoraPrep had been allowed to dry for 3 minutes.  Her arms were tucked to her side with all appropriate precautions.  The shoulders were stabilized with shoulder blocks. The patient was placed in the semi-lithotomy position in [place].  The perineum was prepped with Betadine.  Foley catheter was placed.  A sterile speculum was placed in the vagina.  The cervix was grasped with a single-tooth tenaculum. 1mg  total of ICG was injected into the cervical stroma at 2 and 9 o'clock at a 68mm depth (concentration 0..5mg /ml).  The cervix was dilated with Kennon Rounds dilators.  The ZUMI uterine manipulator with a medium colpotomizer ring was placed without difficulty.  A pneum occluder balloon was placed over the manipulator.  A second time-out was performed.  OG tube placement was confirmed and to suction.   Procedure:  The patient was then draped in the normal manner.  Next, a 5 mm skin incision was made 1 cm below the subcostal margin in the midclavicular line.  The 5 mm Optiview port and scope was used for direct entry.  Opening pressure was under 10 mm CO2.  The abdomen was insufflated and the findings were noted as above.   At this point and all points during the procedure, the patient's intra-abdominal pressure did not exceed 15 mmHg. Next, a 10 mm skin incision was made 2cm above the umbilicus and a right and left port was placed about 10 cm lateral to the robot port on the right and left side.  A fourth arm was placed in the left lower quadrant 2 cm above and superior and medial to the anterior superior iliac spine.  All ports were  placed under direct visualization.  The patient was placed in steep Trendelenburg.  Bowel was away into the upper abdomen.  The robot was docked in the normal manner.  The right and left peritoneum were opened parallel to the IP ligament to open the retroperitoneal spaces bilaterally. The SLN mapping was performed in bilateral pelvic basins. The  para rectal and paravesical spaces were opened up. Lymphatic channels were identified travelling to the following visualized sentinel lymph node's: right obturator and right external iliac SLN, and left common iliac SLN. These SLN's were separated from their surrounding lymphatic tissue, removed and sent for permanent pathology.   Adhesiolysis was performed for 30 minutes in which sharp and monopolar electrosurgery was used to skeletonize and transect the attachments of the uterus to the left pelvic side wall and anterior pelvis. The hysterectomy was started after the round ligament on the right side was incised and the retroperitoneum was entered and the pararectal space was developed.  The ureter was noted to be on the medial leaf of the broad ligament.  The peritoneum above the ureter was incised and stretched and the infundibulopelvic ligament was skeletonized, cauterized and cut.  The posterior peritoneum was taken down to the level of the KOH ring.  The anterior peritoneum was also taken down.  The bladder flap was created to the level of the KOH ring.  The uterine artery on the right side was skeletonized, cauterized and cut in the normal manner.  A similar procedure was performed on the left.  The colpotomy was made and the uterus, cervix, bilateral ovaries and tubes were amputated and delivered through the vagina.  Pedicles were inspected and excellent hemostasis was achieved.    The colpotomy at the vaginal cuff was closed with Vicryl on a CT1 needle in a running manner.  Irrigation was used and excellent hemostasis was achieved.  At this point in the procedure was completed.  Robotic instruments were removed under direct visulaization.  The robot was undocked. The 10 mm ports were closed with Vicryl on a UR-5 needle and the fascia was closed with 0 Vicryl on a UR-5 needle.  The skin was closed with 4-0 Vicryl in a subcuticular manner.  Dermabond was applied.  Sponge, lap and needle counts correct x  2.  The patient was taken to the recovery room in stable condition.  The vagina was swabbed with  minimal bleeding noted.   All instrument and needle counts were correct x  3.   The patient was transferred to the recovery room in a stable condition.  Donaciano Eva, MD

## 2014-11-06 NOTE — Transfer of Care (Signed)
Immediate Anesthesia Transfer of Care Note  Patient: Peggy Freeman  Procedure(s) Performed: Procedure(s): ROBOTIC ASSISTED TOTAL HYSTERECTOMY WITH BILATERAL SALPINGO OOPHORECTOMY, SENTINEL LYMPH NODE BIOPSY, LYSIS OF ADHESIONS (Bilateral)  Patient Location: PACU  Anesthesia Type:General  Level of Consciousness: awake, alert , oriented and patient cooperative  Airway & Oxygen Therapy: Patient Spontanous Breathing and Patient connected to face mask oxygen  Post-op Assessment: Report given to RN and Post -op Vital signs reviewed and stable  Post vital signs: Reviewed and stable  Last Vitals:  Filed Vitals:   11/06/14 0839  BP: 130/75  Pulse: 77  Temp: 36.7 C  Resp: 16    Complications: No apparent anesthesia complications

## 2014-11-06 NOTE — H&P (Signed)
CC:  Chief Complaint  Patient presents with  . endometrial cancer    New consult    Assessment/Plan:  [name] is a 54 y.o. year old with grade 1 endometrial cancer.  A detailed discussion was held with the patient and her family with regard to to her endometrial cancer diagnosis. We discussed the standard management options for uterine cancer which includes surgery followed possibly by adjuvant therapy depending on the results of surgery. The options for surgical management include a hysterectomy and removal of the tubes and ovaries possibly with removal of pelvic and para-aortic lymph nodes. A minimally invasive approach including a robotic hysterectomy or laparoscopic hysterectomy have benefits including shorter hospital stay, recovery time and better wound healing. The alternative approach is an open hysterectomy. The patient has been counseled about these surgical options and the risks of surgery in general including infection, bleeding, damage to surrounding structures (including bowel, bladder, ureters, nerves or vessels), and the postoperative risks of PE/ DVT, and lymphedema. I extensively reviewed the additional risks of robotic hysterectomy including possible need for conversion to open laparotomy. I discussed positioning during surgery of trendelenberg and risks of minor facial swelling and care we take in preoperative positioning. After counseling and consideration of her options, she desires to proceed with robotic hysterectomy, BSO and sentinel lymph node biospy. I counseled the patient about the 3% false negative rate of SLN biopsy, however, a procedure that spares lymphadenectomy and minimizes the risk for lymphedema is important in this patient with a history of chronic LE venous insufficiency from prior DVT.  Because of her factor V leiden mutation she is at increased risk for VTE. She will receive preoperative dosing of lovenox and 28 days of postop lovenox to  mitigate this risk, though that is associated with an increased risk of postop bleeding.   She will be seen by anesthesia for preoperative clearance and discussion of postoperative pain management. She was given the opportunity to ask questions, which were answered to her satisfaction, and she is agreement with the above mentioned plan of care.   HPI: The patient is a 54 year old G3P3 who is seen in consultation at the request of Dr Rogue Bussing for grade 1 endometrial cancer. The patient has been postmenopausal for several years but has begun experiencing intermittent vaginal spotting. She saw Dr Rogue Bussing who performed TV US on 09/24/2014 which revealed a normal size uterus measuring 7 x 5.3 x 4.6 cm with a thickened endometrial stripe measuring 25 mm. The ovaries bilaterally were normal. 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 A Pap smear performed on June 2016 revealed atypical glandular cells.  The patient is overall healthy woman. She most significantly has a history of factor V Leyden mutation and a remote history of a left lower extremity DVT in 1996. She thinks that she might had a prior DVT in 1991 as well. At that time she was treated with Coumadin however has had no further VTE events and is now only taking low-dose aspirin for prophylaxis. 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  She is morbidly obese with a BMI of 39 kg/m.  Her prior abdominal surgeries include 3 prior cesarean sections. She is no family history concerning for GYN malignancy will Lynch syndrome.   Current Meds:  Outpatient Encounter Prescriptions  as of 10/01/2014  Medication Sig  . amphetamine-dextroamphetamine (ADDERALL XR) 30 MG 24 hr capsule Take 30 mg by mouth 2 (two) times daily.    Marland Kitchen ibuprofen (ADVIL,MOTRIN) 200 MG tablet Take 200 mg by mouth as needed.  Marland Kitchen aspirin 81 MG tablet Take 81 mg by mouth 2 (two) times daily.  . [DISCONTINUED] methylphenidate 36 MG PO CR tablet    No facility-administered encounter medications on file as of 10/01/2014.    Allergy: No Known Allergies  Social Hx:  History   Social History  . Marital Status: Married    Spouse Name: N/A  . Number of Children: N/A  . Years of Education: N/A   Occupational History  . Not on file.   Social History Main Topics  . Smoking status: Former Smoker    Quit date: 03/10/1995  . Smokeless tobacco: Not on file  . Alcohol Use: No  . Drug Use: No  . Sexual Activity: Not on file   Other Topics Concern  . Not on file   Social History Narrative  . No narrative on file    Past Surgical Hx:  Past Surgical History  Procedure Laterality Date  . Tubal ligation  09/30/1989  . Cesarean section  1987, 1989, 1991    Past Medical Hx:  Past Medical History  Diagnosis Date  . DVT (deep venous thrombosis)   . Factor V Leiden mutation   . Stress incontinence     Past Gynecological History: C/s x3. AGUS pap in July 2016. No LMP recorded.  Family Hx:  Family History  Problem Relation Age of Onset  . Diabetes Mother   . Hypertension Mother   . Congestive Heart Failure Mother   . Diabetes Brother   . Heart disease Brother   . Kidney failure Brother   . Cancer Maternal Aunt   . Diabetes Paternal Grandmother     Review of Systems:  Constitutional  Feels well,  ENT Normal appearing ears and nares bilaterally Skin/Breast  No rash, sores, jaundice, itching, dryness Cardiovascular  No chest pain, shortness of breath, or edema  Pulmonary  No cough or wheeze.  Gastro Intestinal  No nausea, vomitting, or diarrhoea. No bright red blood per rectum, no  abdominal pain, change in bowel movement, or constipation.  Genito Urinary  No frequency, urgency, dysuria, see HPI Musculo Skeletal  No myalgia, arthralgia, joint swelling or pain  Neurologic  No weakness, numbness, change in gait,  Psychology  No depression, anxiety, insomnia.   Vitals: Blood pressure 118/72, pulse 86, temperature 98.1 F (36.7 C), temperature source Oral, resp. rate 20, height 5\' 8"  (1.727 m), weight 257 lb 4.8 oz (116.711 kg), SpO2 100 %.  Physical Exam: WD in NAD Neck  Supple NROM, without any enlargements.  Lymph Node Survey No cervical supraclavicular or inguinal adenopathy Cardiovascular  Pulse normal rate, regularity and rhythm. S1 and S2 normal.  Lungs  Clear to auscultation bilateraly, without wheezes/crackles/rhonchi. Good air movement.  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 Psychiatry  Alert and oriented to person, place, and time  Abdomen  Normoactive bowel sounds, abdomen soft, non-tender and obese without evidence of hernia.  Back No CVA tenderness Genito Urinary  Vulva/vagina: Normal external female genitalia. No lesions. No discharge or bleeding. Bladder/urethra: No lesions or masses, well supported bladder Vagina: normal Cervix: Normal appearing, no lesions. Uterus: Small, mobile, no parametrial involvement or nodularity. Adnexa: no palpable masses. Rectal  Good tone,  no masses no cul de sac nodularity.  Extremities  No bilateral cyanosis, clubbing or edema.  Donaciano Eva, MD 11/06/14

## 2014-11-06 NOTE — Anesthesia Procedure Notes (Signed)
Procedure Name: Intubation Date/Time: 11/06/2014 11:49 AM Performed by: Sherian Maroon A Pre-anesthesia Checklist: Patient identified, Emergency Drugs available, Suction available, Patient being monitored and Timeout performed Patient Re-evaluated:Patient Re-evaluated prior to inductionOxygen Delivery Method: Circle system utilized Preoxygenation: Pre-oxygenation with 100% oxygen Intubation Type: IV induction Ventilation: Mask ventilation without difficulty Laryngoscope Size: Mac and 3 Grade View: Grade I Tube type: Oral Tube size: 7.5 mm Number of attempts: 1 Airway Equipment and Method: Bougie stylet Placement Confirmation: ETT inserted through vocal cords under direct vision,  positive ETCO2 and breath sounds checked- equal and bilateral Secured at: 21 cm Tube secured with: Tape Dental Injury: Teeth and Oropharynx as per pre-operative assessment

## 2014-11-07 ENCOUNTER — Encounter (HOSPITAL_COMMUNITY): Payer: Self-pay | Admitting: Gynecologic Oncology

## 2014-11-07 DIAGNOSIS — C541 Malignant neoplasm of endometrium: Secondary | ICD-10-CM | POA: Diagnosis not present

## 2014-11-07 LAB — BASIC METABOLIC PANEL
ANION GAP: 8 (ref 5–15)
BUN: 15 mg/dL (ref 6–20)
CHLORIDE: 104 mmol/L (ref 101–111)
CO2: 28 mmol/L (ref 22–32)
Calcium: 9.2 mg/dL (ref 8.9–10.3)
Creatinine, Ser: 0.81 mg/dL (ref 0.44–1.00)
GFR calc non Af Amer: >60 mL/min (ref >60)
Glucose, Bld: 140 mg/dL — ABNORMAL HIGH (ref 65–99)
POTASSIUM: 4.5 mmol/L (ref 3.5–5.1)
SODIUM: 140 mmol/L (ref 135–145)

## 2014-11-07 LAB — CBC
HCT: 39.4 % (ref 36.0–46.0)
Hemoglobin: 12.8 g/dL (ref 12.0–15.0)
MCH: 29.8 pg (ref 26.0–34.0)
MCHC: 32.5 g/dL (ref 30.0–36.0)
MCV: 91.8 fL (ref 78.0–100.0)
Platelets: 248 10*3/uL (ref 150–400)
RBC: 4.29 MIL/uL (ref 3.87–5.11)
RDW: 14.5 % (ref 11.5–15.5)
WBC: 14.7 10*3/uL — ABNORMAL HIGH (ref 4.0–10.5)

## 2014-11-07 MED ORDER — AMPHETAMINE-DEXTROAMPHET ER 30 MG PO CP24
30.0000 mg | ORAL_CAPSULE | Freq: Two times a day (BID) | ORAL | Status: DC
  Administered 2014-11-07: 30 mg via ORAL
  Filled 2014-11-07: qty 1

## 2014-11-07 MED ORDER — OXYCODONE HCL 5 MG PO TABS: 5.0000 mg | ORAL_TABLET | ORAL | Status: DC | PRN

## 2014-11-07 NOTE — Discharge Instructions (Signed)
11/06/2014  Return to work: 4-6 weeks  Activity: 1. Be up and out of the bed during the day.  Take a nap if needed.  You may walk up steps but be careful and use the hand rail.  Stair climbing will tire you more than you think, you may need to stop part way and rest.   2. No lifting or straining for 6 weeks.  3. No driving for 1 weeks.  Do Not drive if you are taking narcotic pain medicine.  4. Shower daily.  Use soap and water on your incision and pat dry; don't rub.   5. No sexual activity and nothing in the vagina for 6-8 weeks.  Diet: 1. Low sodium Heart Healthy Diet is recommended.  2. It is safe to use a laxative if you have difficulty moving your bowels.   Wound Care: 1. Keep clean and dry.  Shower daily.  Reasons to call the Doctor:   Fever - Oral temperature greater than 100.4 degrees Fahrenheit  Foul-smelling vaginal discharge  Difficulty urinating  Nausea and vomiting  Increased pain at the site of the incision that is unrelieved with pain medicine.  Difficulty breathing with or without chest pain  New calf pain especially if only on one side  Sudden, continuing increased vaginal bleeding with or without clots.   Follow-up: 1. See Everitt Amber in 2-3 weeks.  Contacts: For questions or concerns you should contact:  Dr. Everitt Amber at [phone]  or at [place]  Abdominal Hysterectomy, Care After These instructions give you information on caring for yourself after your procedure. Your doctor may also give you more specific instructions. Call your doctor if you have any problems or questions after your procedure.  HOME CARE It takes 4-6 weeks to recover from this surgery. Follow all of your doctor's instructions.   Only take medicines as told by your doctor.  Change your bandage as told by your doctor.  Return to your doctor to have your stitches taken out.  Take showers for 2-3 weeks. Ask your doctor when it is okay to shower.  Do not  douche, use tampons, or have sex (intercourse) for at least 6 weeks or as told.  Follow your doctor's advice about exercise, lifting objects, driving, and general activities.  Get plenty of rest and sleep.  Do not lift anything heavier than a gallon of milk (about 10 pounds [4.5 kilograms]) for the first month after surgery.  Get back to your normal diet as told by your doctor.  Do not drink alcohol until your doctor says it is okay.  Take a medicine to help you poop (laxative) as told by your doctor.  Eating foods high in fiber may help you poop. Eat a lot of raw fruits and vegetables, whole grains, and beans.  Drink enough fluids to keep your pee (urine) clear or pale yellow.  Have someone help you at home for 1-2 weeks after your surgery.  Keep follow-up doctor visits as told. GET HELP IF:  You have chills or fever.  You have puffiness, redness, or pain in area of the cut (incision).  You have yellowish-white fluid (pus) coming from the cut.  You have a bad smell coming from the cut or bandage.  Your cut pulls apart.  You feel dizzy or light-headed.  You have pain or bleeding when you pee.  You keep having watery poop (diarrhea).  You keep feeling sick to your stomach (nauseous) or keep throwing up (vomiting).  You  have fluid (discharge) coming from your vagina.  You have a rash.  You have a reaction to your medicine.  You need stronger pain medicine. GET HELP RIGHT AWAY IF:   You have a fever and your symptoms suddenly get worse.  You have bad belly (abdominal) pain.  You have chest pain.  You are short of breath.  You pass out (faint).  You have pain, puffiness, or redness of your leg.  You bleed a lot from your vagina and notice clumps of tissue (clots). MAKE SURE YOU:   Understand these instructions.  Will watch your condition.  Will get help right away if you are not doing well or get worse. Document Released: 12/03/2007 Document Revised:  02/28/2013 Document Reviewed: 12/16/2012 [facility] Patient Information 2015 [place], [place]. This information is not intended to replace advice given to you by your health care provider. Make sure you discuss any questions you have with your health care provider.  Oxycodone tablets or capsules What is this medicine? OXYCODONE (ox i KOE done) is a pain reliever. It is used to treat moderate to severe pain. This medicine may be used for other purposes; ask your health care provider or pharmacist if you have questions. COMMON BRAND NAME(S): Dazidox, Endocodone, OXECTA, OxyIR, Percolone, Roxicodone What should I tell my health care provider before I take this medicine? They need to know if you have any of these conditions: -Addison's disease -brain tumor -drug abuse or addiction -head injury -heart disease -if you frequently drink alcohol containing drinks -kidney disease or problems going to the bathroom -liver disease -lung disease, asthma, or breathing problems -mental problems -an unusual or allergic reaction to oxycodone, codeine, hydrocodone, morphine, other medicines, foods, dyes, or preservatives -pregnant or trying to get pregnant -breast-feeding How should I use this medicine? Take this medicine by mouth with a glass of water. Follow the directions on the prescription label. You can take it with or without food. If it upsets your stomach, take it with food. Take your medicine at regular intervals. Do not take it more often than directed. Do not stop taking except on your doctor's advice. Some brands of this medicine, like Oxecta, have special instructions. Ask your doctor or pharmacist if these directions are for you: Do not cut, crush or chew this medicine. Swallow only one tablet at a time. Do not wet, soak, or lick the tablet before you take it. Talk to your pediatrician regarding the use of this medicine in children. Special care may be needed. Overdosage: If you think you have  taken too much of this medicine contact a poison control center or emergency room at once. NOTE: This medicine is only for you. Do not share this medicine with others. What if I miss a dose? If you miss a dose, take it as soon as you can. If it is almost time for your next dose, take only that dose. Do not take double or extra doses. What may interact with this medicine? -alcohol -antihistamines -certain medicines used for nausea like chlorpromazine, droperidol -erythromycin -ketoconazole -medicines for depression, anxiety, or psychotic disturbances -medicines for sleep -muscle relaxants -naloxone -naltrexone -narcotic medicines (opiates) for pain -nilotinib -phenobarbital -phenytoin -rifampin -ritonavir -voriconazole This list may not describe all possible interactions. Give your health care provider a list of all the medicines, herbs, non-prescription drugs, or dietary supplements you use. Also tell them if you smoke, drink alcohol, or use illegal drugs. Some items may interact with your medicine. What should I watch for while  using this medicine? Tell your doctor or health care professional if your pain does not go away, if it gets worse, or if you have new or a different type of pain. You may develop tolerance to the medicine. Tolerance means that you will need a higher dose of the medicine for pain relief. Tolerance is normal and is expected if you take this medicine for a long time. Do not suddenly stop taking your medicine because you may develop a severe reaction. Your body becomes used to the medicine. This does NOT mean you are addicted. Addiction is a behavior related to getting and using a drug for a non-medical reason. If you have pain, you have a medical reason to take pain medicine. Your doctor will tell you how much medicine to take. If your doctor wants you to stop the medicine, the dose will be slowly lowered over time to avoid any side effects. You may get drowsy or dizzy  when you first start taking this medicine or change doses. Do not drive, use machinery, or do anything that may be dangerous until you know how the medicine affects you. Stand or sit up slowly. There are different types of narcotic medicines (opiates) for pain. If you take more than one type at the same time, you may have more side effects. Give your health care provider a list of all medicines you use. Your doctor will tell you how much medicine to take. Do not take more medicine than directed. Call emergency for help if you have problems breathing. This medicine will cause constipation. Try to have a bowel movement at least every 2 to 3 days. If you do not have a bowel movement for 3 days, call your doctor or health care professional. Your mouth may get dry. Drinking water, chewing sugarless gum, or sucking on hard candy may help. See your dentist every 6 months. What side effects may I notice from receiving this medicine? Side effects that you should report to your doctor or health care professional as soon as possible: -allergic reactions like skin rash, itching or hives, swelling of the face, lips, or tongue -breathing problems -confusion -feeling faint or lightheaded, falls -trouble passing urine or change in the amount of urine -unusually weak or tired Side effects that usually do not require medical attention (report to your doctor or health care professional if they continue or are bothersome): -constipation -dry mouth -itching -nausea, vomiting -upset stomach This list may not describe all possible side effects. Call your doctor for medical advice about side effects. You may report side effects to FDA at 1-800-FDA-1088. Where should I keep my medicine? Keep out of the reach of children. This medicine can be abused. Keep your medicine in a safe place to protect it from theft. Do not share this medicine with anyone. Selling or giving away this medicine is dangerous and against the  law. Store at room temperature between 15 and 30 degrees C (59 and 86 degrees F). Protect from light. Keep container tightly closed. This medicine may cause accidental overdose and death if it is taken by other adults, children, or pets. Flush any unused medicine down the toilet to reduce the chance of harm. Do not use the medicine after the expiration date. NOTE: This sheet is a summary. It may not cover all possible information. If you have questions about this medicine, talk to your doctor, pharmacist, or health care provider.  2015, [employer]vier/[employer] Standard. (2012-11-03 13:43:33)

## 2014-11-07 NOTE — Anesthesia Postprocedure Evaluation (Signed)
  Anesthesia Post-op Note  Patient: Peggy Freeman  Procedure(s) Performed: Procedure(s): ROBOTIC ASSISTED TOTAL HYSTERECTOMY WITH BILATERAL SALPINGO OOPHORECTOMY, SENTINEL LYMPH NODE BIOPSY, LYSIS OF ADHESIONS (Bilateral)  Patient Location: PACU  Anesthesia Type:General  Level of Consciousness: awake  Airway and Oxygen Therapy: Patient Spontanous Breathing  Post-op Pain: mild  Post-op Assessment: Post-op Vital signs reviewed, Patient's Cardiovascular Status Stable, Respiratory Function Stable, Patent Airway, No signs of Nausea or vomiting and Pain level controlled              Post-op Vital Signs: Reviewed and stable  Last Vitals:  Filed Vitals:   11/07/14 0529  BP: 129/63  Pulse: 62  Temp: 36.8 C  Resp: 18    Complications: No apparent anesthesia complications

## 2014-11-07 NOTE — Discharge Summary (Signed)
Physician Discharge Summary  Patient ID: Peggy Freeman MRN: [id number] DOB/AGE: 08-14-1960 54 y.o.  Admit date: 11/06/2014 Discharge date: 11/07/2014  Admission Diagnoses: Endometrial cancer  Discharge Diagnoses:  Principal Problem:   Endometrial cancer Active Problems:   Endometrial cancer, grade I   Discharged Condition:  The patient is in good condition and stable for discharge.    Hospital Course: On 11/06/2014, the patient underwent the following: Procedure(s): ROBOTIC ASSISTED TOTAL HYSTERECTOMY WITH BILATERAL SALPINGO OOPHORECTOMY, SENTINEL LYMPH NODE BIOPSY, LYSIS OF ADHESIONS.  The postoperative course was uneventful.  She was discharged to home on postoperative day 1 tolerating a regular diet, passing flatus, minimal pain, voiding without difficulty.  Consults: None  Significant Diagnostic Studies: None  Treatments: surgery: see above  Discharge Exam: Blood pressure 129/63, pulse 62, temperature 98.3 F (36.8 C), temperature source Oral, resp. rate 18, height 5\' 8"  (1.727 m), weight 259 lb 6 oz (117.652 kg), last menstrual period 10/04/2014, SpO2 98 %. General appearance: alert, cooperative and no distress Resp: clear to auscultation bilaterally Cardio: regular rate and rhythm, S1, S2 normal, no murmur, click, rub or gallop GI: soft, non-tender; bowel sounds normal; no masses,  no organomegaly Extremities: extremities normal, atraumatic, no cyanosis or edema Incision/Wound: Lap sites with dermabond without erythema or drainage  Disposition: Home      Discharge Instructions    Call MD for:  difficulty breathing, headache or visual disturbances    Complete by:  As directed      Call MD for:  extreme fatigue    Complete by:  As directed      Call MD for:  hives    Complete by:  As directed      Call MD for:  persistant dizziness or light-headedness    Complete by:  As directed      Call MD for:  persistant nausea and vomiting    Complete by:  As directed       Call MD for:  redness, tenderness, or signs of infection (pain, swelling, redness, odor or green/yellow discharge around incision site)    Complete by:  As directed      Call MD for:  severe uncontrolled pain    Complete by:  As directed      Call MD for:  temperature >100.4    Complete by:  As directed      Diet - low sodium heart healthy    Complete by:  As directed      Driving Restrictions    Complete by:  As directed   No driving for 1 week.  Do not take narcotics and drive.     Increase activity slowly    Complete by:  As directed      Lifting restrictions    Complete by:  As directed   No lifting greater than 10 lbs.     Sexual Activity Restrictions    Complete by:  As directed   No sexual activity, nothing in the vagina, for 8 weeks.            Medication List    TAKE these medications        amphetamine-dextroamphetamine 30 MG 24 hr capsule  Commonly known as:  ADDERALL XR  Take 30 mg by mouth 2 (two) times daily.     aspirin 81 MG tablet  Take 81 mg by mouth daily.     [employer] B-12 1000 MCG/15ML Liqd  Generic drug:  Cyanocobalamin  Take 1 mL by mouth daily.  EMERGEN-C IMMUNE PLUS Pack  Take 1 Package by mouth daily.     ibuprofen 200 MG tablet  Commonly known as:  ADVIL,MOTRIN  Take 400 mg by mouth every 8 (eight) hours as needed for mild pain.     oxyCODONE 5 MG immediate release tablet  Commonly known as:  Oxy IR/ROXICODONE  Take 1-2 tablets (5-10 mg total) by mouth every 4 (four) hours as needed for severe pain.     Vitamin D3 5000 UNITS Caps  Take 5,000 Units by mouth daily.       Follow-up Information    Follow up with Donaciano Eva, MD On 12/06/2014.   Specialty:  Obstetrics and Gynecology   Why:  at 10:30am at the [facility] information:   [place] [phone]       Greater than thirty minutes were spend for face to face discharge instructions and discharge orders/summary in EPIC.    Signed: Pierre Dellarocco DEAL 11/07/2014, 9:18 AM

## 2014-11-07 NOTE — Progress Notes (Signed)
Vital signs stable, no c/o pain.  Patient is tolerating regular diet and ambulating in hall.  Reviewed discharge instructions.  Prescription given to patient.  Patient verbalizes understanding.  Discharge to home with spouse.

## 2014-11-09 ENCOUNTER — Telehealth: Payer: Self-pay | Admitting: Gynecologic Oncology

## 2014-11-09 NOTE — Telephone Encounter (Signed)
Informed patient of diagnosis of IA 1 endometrial cancer. No adjuvant therapy indicated. She is healing well at home. No complaints.  Donaciano Eva, MD

## 2014-12-06 ENCOUNTER — Encounter: Payer: Self-pay | Admitting: Gynecologic Oncology

## 2014-12-06 ENCOUNTER — Ambulatory Visit: Payer: 59 | Attending: Gynecologic Oncology | Admitting: Gynecologic Oncology

## 2014-12-06 VITALS — BP 111/68 | HR 83 | Temp 98.2°F | Resp 20 | Ht 68.0 in | Wt 256.9 lb

## 2014-12-06 DIAGNOSIS — C541 Malignant neoplasm of endometrium: Secondary | ICD-10-CM | POA: Diagnosis not present

## 2014-12-06 NOTE — Patient Instructions (Signed)
Plan to follow up in six months or sooner if needed.  Please call closer to the date to schedule. 

## 2014-12-06 NOTE — Progress Notes (Signed)
PROGRESS NOTE: POSTOP ENDOMETRIAL CANCER  Assessment:    54 y.o. year old with Stage IA Grade 1 endometrioid endometrial cancer.   S/p robotic assisted total hysterectomy, BSO,  Bilateral SLN biopsy on 11/06/14. no LVSI, no myometrial invasion, negative pelvic washings and negative lymph nodes.   Plan: 1) Pathology reports reviewed today 2) Treatment counseling - Very low risk (<5%) for recurrence given age, grade, depth of myometrial invasion and LVSI status. Multidisciplinary tumor board recommendation is for routine surveillance with frequent pelvic exams and visits with annual pap smear.  We will start with visits every 6 months at which time she can return to annual visits.  Discussed signs and symptoms of recurrence including vaginal bleeding or discharge, leg pain or swelling and changes in bowel or bladder habits. She was given the opportunity to ask questions, which were answered to her satisfaction, and she is agreement with the above mentioned plan of care.  3)  Return to clinic in 6 months, and to see Dr Rogue Bussing in 12 months  HPI:  Peggy Freeman is a 54 y.o. year old  initially seen in consultation on 10/01/14 referred by Dr Rogue Bussing.  She then underwent a robotic hysterectomy, BSO, and bilateral SLN biopsy on 08/25/48 without complications.  Her postoperative course was uncomplicated.  Her final pathologic diagnosis is a Stage IA Grade 1 endometrioid endometrial cancer with no lymphovascular space invasion, no myometrial invasion and negative lymph nodes.  She is seen today for a postoperative check and to discuss her pathology results and treatment plan.  Since discharge from the hospital, she is feeling well.  She has improving appetite, normal bowel and bladder function, and pain controlled with minimal PO medication. She has no other complaints today.  Review of systems: Constitutional:  She has no weight gain or weight loss. She has no fever or chills. Eyes: No blurred  vision Ears, Nose, Mouth, Throat: No dizziness, headaches or changes in hearing. No mouth sores. Cardiovascular: No chest pain, palpitations or edema. Respiratory:  No shortness of breath, wheezing or cough Gastrointestinal: She has normal bowel movements without diarrhea or constipation. She denies any nausea or vomiting. She denies blood in her stool or heart burn. Genitourinary:  She denies pelvic pain, pelvic pressure or changes in her urinary function. She has no hematuria, dysuria, or incontinence. She has no irregular vaginal bleeding or vaginal discharge Musculoskeletal: Denies muscle weakness or joint pains.  Skin:  She has no skin changes, rashes or itching Neurological:  Denies dizziness or headaches. No neuropathy, no numbness or tingling. Psychiatric:  She denies depression or anxiety. Hematologic/Lymphatic:   No easy bruising or bleeding   Physical Exam: Blood pressure 111/68, pulse 83, temperature 98.2 F (36.8 C), temperature source Oral, resp. rate 20, height 5\' 8"  (1.727 m), weight 256 lb 14.4 oz (116.529 kg), SpO2 100 %. General: Well dressed, well nourished in no apparent distress.   HEENT:  Normocephalic and atraumatic, no lesions.  Extraocular muscles intact. Sclerae anicteric. Pupils equal, round, reactive. No mouth sores or ulcers. Thyroid is normal size, not nodular, midline. Skin:  No lesions or rashes. Abdomen:  Soft, nontender, nondistended.  No palpable masses.  No hepatosplenomegaly.  No ascites. Normal bowel sounds.  No hernias.  Incisions are well healed. Genitourinary: Normal EGBUS  Vaginal cuff intact.  No bleeding or discharge.  No cul de sac fullness. Extremities: No cyanosis, clubbing or edema.  No calf tenderness or erythema. No palpable cords. Psychiatric: Mood and affect are appropriate. Neurological:  Awake, alert and oriented x 3. Sensation is intact, no neuropathy.  Musculoskeletal: No pain, normal strength and range of motion.  15 minutes of face  to face counseling was spent with the patient.  Donaciano Eva, MD

## 2015-06-28 ENCOUNTER — Encounter: Payer: Self-pay | Admitting: Gynecologic Oncology

## 2015-06-28 ENCOUNTER — Ambulatory Visit: Payer: 59 | Attending: Gynecologic Oncology | Admitting: Gynecologic Oncology

## 2015-06-28 VITALS — BP 113/73 | HR 102 | Temp 98.1°F | Resp 18 | Ht 68.0 in | Wt 261.4 lb

## 2015-06-28 DIAGNOSIS — C541 Malignant neoplasm of endometrium: Secondary | ICD-10-CM | POA: Insufficient documentation

## 2015-06-28 DIAGNOSIS — N393 Stress incontinence (female) (male): Secondary | ICD-10-CM | POA: Diagnosis not present

## 2015-06-28 DIAGNOSIS — D6851 Activated protein C resistance: Secondary | ICD-10-CM | POA: Diagnosis not present

## 2015-06-28 DIAGNOSIS — F909 Attention-deficit hyperactivity disorder, unspecified type: Secondary | ICD-10-CM | POA: Insufficient documentation

## 2015-06-28 DIAGNOSIS — Z87891 Personal history of nicotine dependence: Secondary | ICD-10-CM | POA: Diagnosis not present

## 2015-06-28 DIAGNOSIS — Z86718 Personal history of other venous thrombosis and embolism: Secondary | ICD-10-CM | POA: Diagnosis not present

## 2015-06-28 DIAGNOSIS — Z7982 Long term (current) use of aspirin: Secondary | ICD-10-CM | POA: Insufficient documentation

## 2015-06-28 DIAGNOSIS — E669 Obesity, unspecified: Secondary | ICD-10-CM | POA: Diagnosis not present

## 2015-06-28 DIAGNOSIS — Z8542 Personal history of malignant neoplasm of other parts of uterus: Secondary | ICD-10-CM | POA: Diagnosis not present

## 2015-06-28 DIAGNOSIS — Z90710 Acquired absence of both cervix and uterus: Secondary | ICD-10-CM | POA: Diagnosis not present

## 2015-06-28 DIAGNOSIS — D759 Disease of blood and blood-forming organs, unspecified: Secondary | ICD-10-CM | POA: Insufficient documentation

## 2015-06-28 NOTE — Patient Instructions (Signed)
Plan to follow up in six months with Dr. Rogue Bussing and in one year with Dr. Denman George.  Please call our office at [phone] after your appointment with Dr. Rogue Bussing to schedule an appt with Dr. Denman George.

## 2015-06-28 NOTE — Progress Notes (Signed)
PROGRESS NOTE: POSTOP ENDOMETRIAL CANCER  Assessment:    55 y.o. year old with Stage IA Grade 1 endometrioid endometrial cancer.   S/p robotic assisted total hysterectomy, BSO,  Bilateral SLN biopsy on 11/06/14. no LVSI, no myometrial invasion, negative pelvic washings and negative lymph nodes. Complete response, no evidence of disease Grief and adjustment disorder from recent loss of her son.  Plan: 1) Discussed signs and symptoms of recurrence including vaginal bleeding or discharge, leg pain or swelling and changes in bowel or bladder habits. 2) needs screening colonoscopy - discussed the importance of this. She will inform us if she requires assistance in scheduling 3) obesity - counseled regarding weight loss and exercise strategies 4)  Return to clinic in 6 months to see Dr Rogue Bussing and in 12 months to see me.  HPI:  Sude Peggy Freeman is a 55 y.o. year old  initially seen in consultation on 10/01/14 referred by Dr Rogue Bussing.  She then underwent a robotic hysterectomy, BSO, and bilateral SLN biopsy on [id number] without complications.  Her postoperative course was uncomplicated.  Her final pathologic diagnosis is a Stage IA Grade 1 endometrioid endometrial cancer with no lymphovascular space invasion, no myometrial invasion and negative lymph nodes.  She was diagnosed as low risk for recurrence and recommended for close follow-up, no adjuvant therapy recommended.  Interval Hx: She is seen today for a scheduled surveillance visit. She has experienced an unexpected death of her son in an ATV accident 6 weeks ago and is still experiencing extreme grief from this. Since surgery she has had weight gain and difficulty losing this. She had a single episode of vaginal spotting in the past few months.  Past Medical History  Diagnosis Date  . DVT (deep venous thrombosis) ([place])   . Factor V Leiden mutation ([place])   . Stress incontinence   . Blood dyscrasia     Factor V Leiden mutation  . Cancer [facility])      endometrial  . ADHD (attention deficit hyperactivity disorder)    Past Surgical History  Procedure Laterality Date  . Tubal ligation  09/30/1989  . Cesarean section  1987, 1989, 1991  . Laparoscopic gastric sleeve resection  2011  . Robotic assisted total hysterectomy with bilateral salpingo oopherectomy Bilateral 11/06/2014    Procedure: ROBOTIC ASSISTED TOTAL HYSTERECTOMY WITH BILATERAL SALPINGO OOPHORECTOMY, SENTINEL LYMPH NODE BIOPSY, LYSIS OF ADHESIONS;  Surgeon: Everitt Amber, MD;  Location: [facility];  Service: Gynecology;  Laterality: Bilateral;   Family History  Problem Relation Age of Onset  . Diabetes Mother   . Hypertension Mother   . Congestive Heart Failure Mother   . Diabetes Brother   . Heart disease Brother   . Kidney failure Brother   . Cancer Maternal Aunt   . Diabetes Paternal Grandmother    Social History   Social History  . Marital Status: Married    Spouse Name: N/A  . Number of Children: N/A  . Years of Education: N/A   Occupational History  . Not on file.   Social History Main Topics  . Smoking status: Former Smoker    Quit date: 03/10/1995  . Smokeless tobacco: Not on file  . Alcohol Use: No  . Drug Use: No  . Sexual Activity: Not on file   Other Topics Concern  . Not on file   Social History Narrative   Current Outpatient Prescriptions on File Prior to Visit  Medication Sig Dispense Refill  . aspirin 81 MG tablet Take 81 mg by mouth  daily.     . Cholecalciferol (VITAMIN D3) 5000 UNITS CAPS Take 5,000 Units by mouth daily.    . Cyanocobalamin ([employer] B-12) 1000 MCG/15ML LIQD Take 1 mL by mouth daily.    Marland Kitchen ibuprofen (ADVIL,MOTRIN) 200 MG tablet Take 400 mg by mouth every 8 (eight) hours as needed for mild pain.     . methylphenidate 36 MG PO CR tablet Take 36 mg by mouth 2 (two) times daily.     . Multiple Vitamins-Minerals (EMERGEN-C IMMUNE PLUS) PACK Take 1 Package by mouth daily.     No current facility-administered medications on file prior to  visit.   No Known Allergies   Review of systems: Constitutional:  She has no weight gain or weight loss. She has no fever or chills. Eyes: No blurred vision Ears, Nose, Mouth, Throat: No dizziness, headaches or changes in hearing. No mouth sores. Cardiovascular: No chest pain, palpitations or edema. Respiratory:  No shortness of breath, wheezing or cough Gastrointestinal: She has normal bowel movements without diarrhea or constipation. She denies any nausea or vomiting. She denies blood in her stool or heart burn. Genitourinary:  She denies pelvic pain, pelvic pressure or changes in her urinary function. She has no hematuria, dysuria, or incontinence. She has no irregular vaginal bleeding or vaginal discharge Musculoskeletal: Denies muscle weakness or joint pains.  Skin:  She has no skin changes, rashes or itching Neurological:  Denies dizziness or headaches. No neuropathy, no numbness or tingling. Psychiatric:  She denies depression or anxiety. Hematologic/Lymphatic:   No easy bruising or bleeding   Physical Exam: Blood pressure 113/73, pulse 102, temperature 98.1 F (36.7 C), temperature source Oral, resp. rate 18, height 5\' 8"  (1.727 m), weight 261 lb 6.4 oz (118.57 kg), SpO2 98 %. General: Well dressed, well nourished in no apparent distress.   HEENT:  Normocephalic and atraumatic, no lesions.  Extraocular muscles intact. Sclerae anicteric. Pupils equal, round, reactive. No mouth sores or ulcers. Thyroid is normal size, not nodular, midline. Skin:  No lesions or rashes. Abdomen:  Soft, nontender, nondistended.  No palpable masses.  No hepatosplenomegaly.  No ascites. Normal bowel sounds.  No hernias.  Incisions are well healed. Genitourinary: Normal EGBUS  Vaginal cuff intact.  No bleeding or discharge no lesions.  No cul de sac fullness. Extremities: No cyanosis, clubbing or edema.  No calf tenderness or erythema. No palpable cords. Psychiatric: Mood and affect are  appropriate. Neurological: Awake, alert and oriented x 3. Sensation is intact, no neuropathy.  Musculoskeletal: No pain, normal strength and range of motion.  Donaciano Eva, MD

## 2016-06-28 IMAGING — CR DG CHEST 2V
2 series · 2 of 2 positions shown · non-contrast
Comparison: None in PACs

CLINICAL DATA: Preoperative study prior to surgery for endometrial
malignancy, remote history of tobacco use. History of DVT.

EXAM:
CHEST  2 VIEW

[w chest pa]
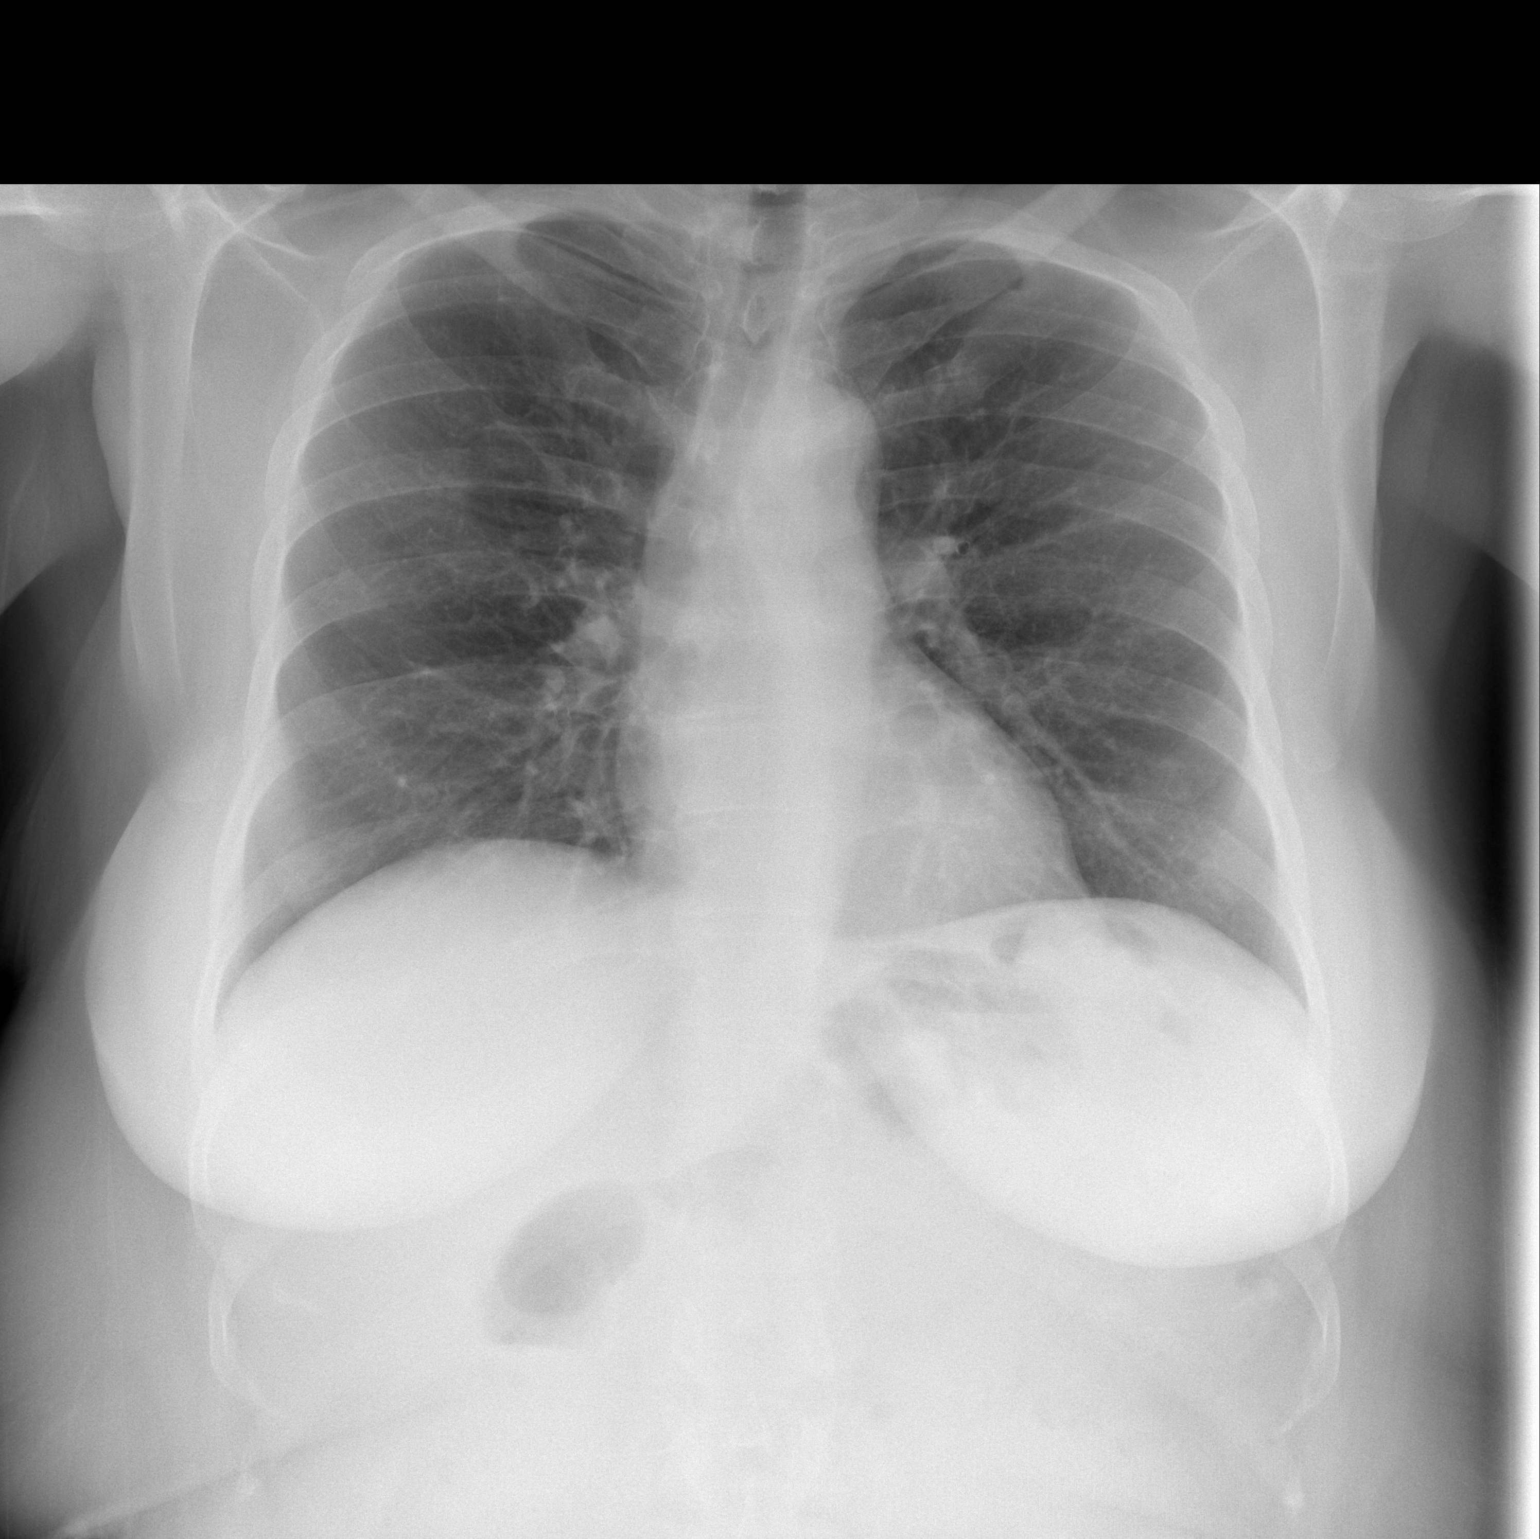

[w chest lat]
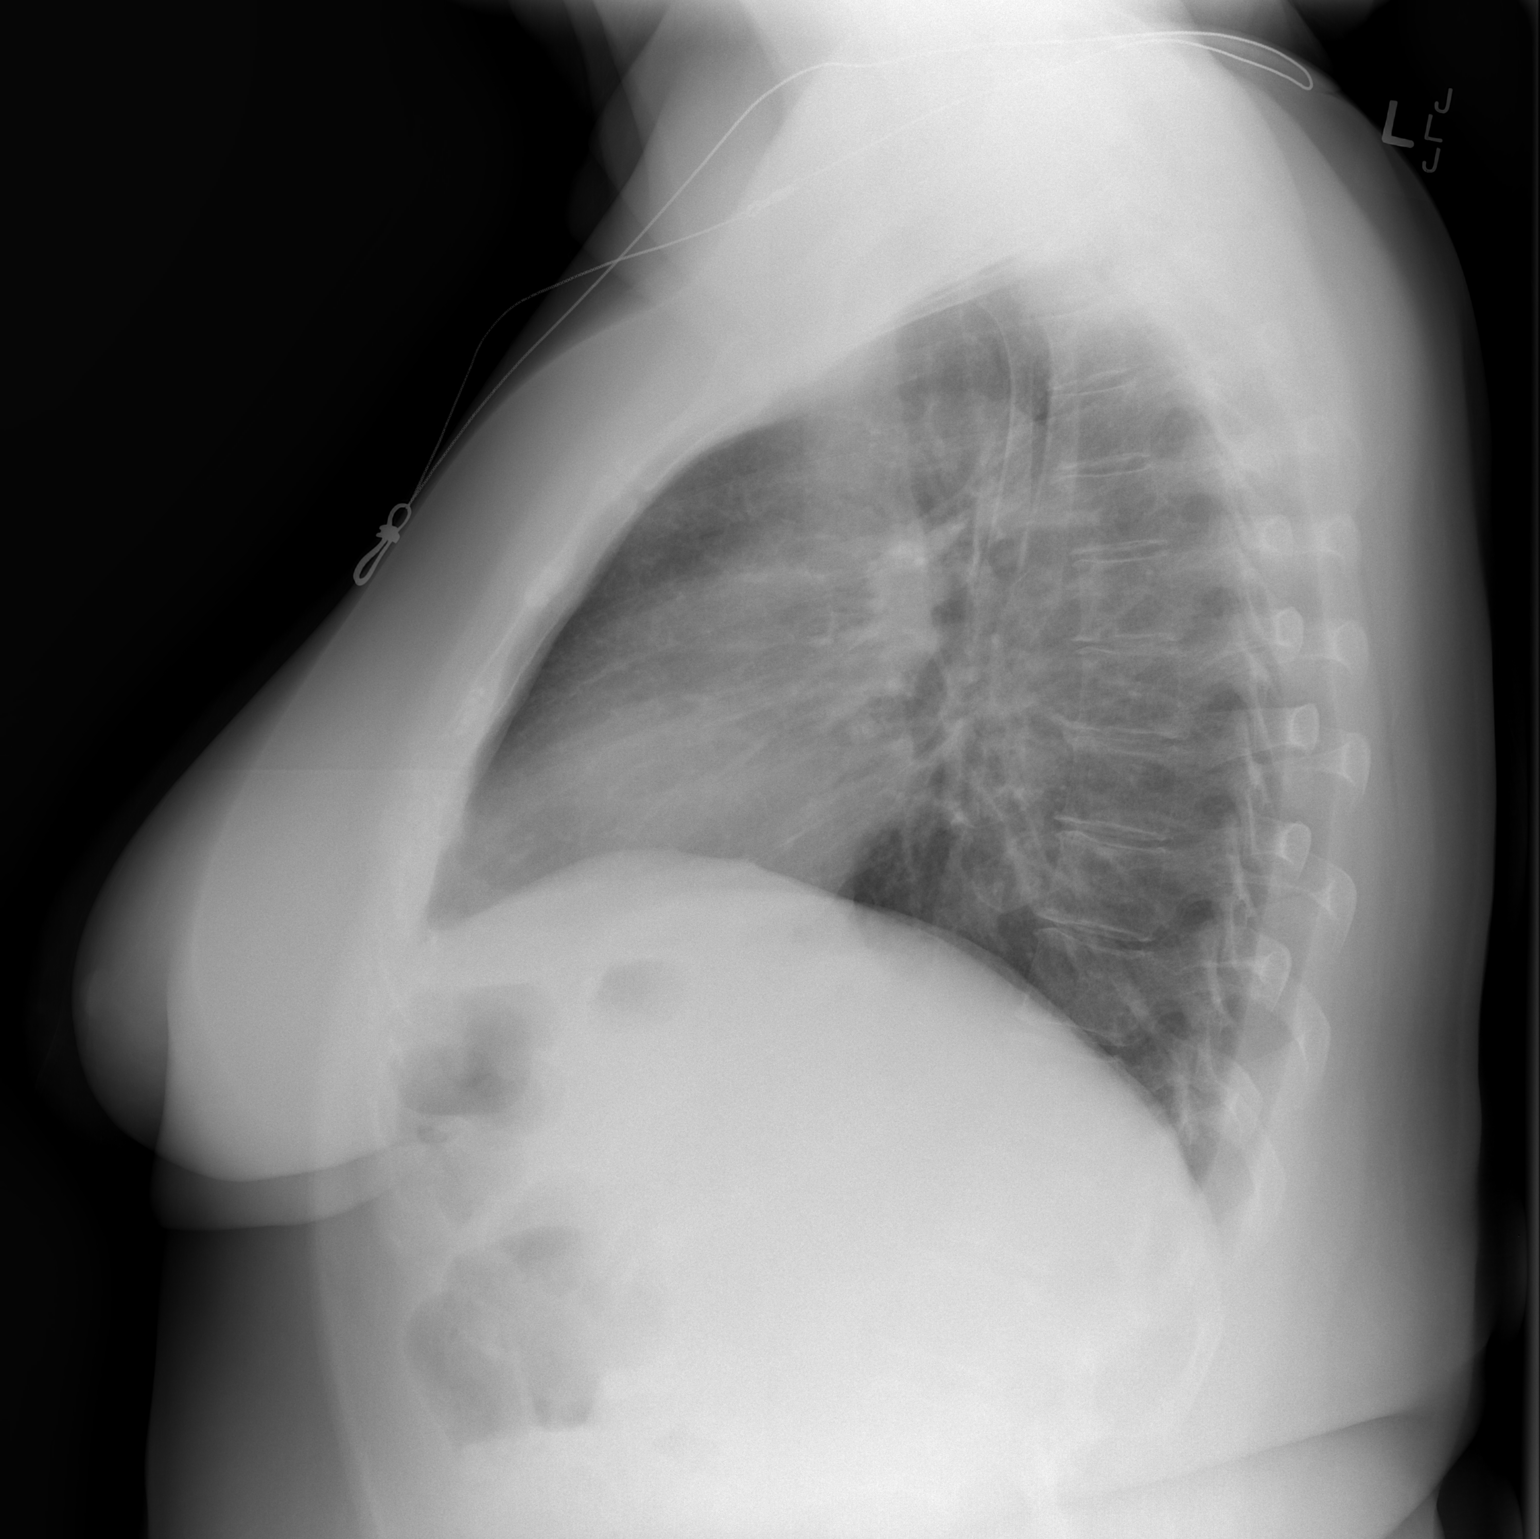

[2 of 2 positions shown; findings below may reference images not displayed]

FINDINGS: The lungs are adequately inflated and clear. The heart and pulmonary
vascularity are normal. The mediastinum is normal in width. There is
no pleural effusion. There is mild multilevel degenerative disc
space narrowing of the thoracic spine.
IMPRESSION: There is no active cardiopulmonary disease.

## 2018-12-09 ENCOUNTER — Emergency Department (HOSPITAL_BASED_OUTPATIENT_CLINIC_OR_DEPARTMENT_OTHER): Payer: 59

## 2018-12-09 ENCOUNTER — Emergency Department (HOSPITAL_COMMUNITY)
Admission: EM | Admit: 2018-12-09 | Discharge: 2018-12-09 | Disposition: A | Discharge status: Home / Self Care | Payer: 59 | Attending: Emergency Medicine | Admitting: Emergency Medicine

## 2018-12-09 DIAGNOSIS — Z86718 Personal history of other venous thrombosis and embolism: Secondary | ICD-10-CM | POA: Diagnosis not present

## 2018-12-09 DIAGNOSIS — Z5321 Procedure and treatment not carried out due to patient leaving prior to being seen by health care provider: Secondary | ICD-10-CM | POA: Insufficient documentation

## 2018-12-09 DIAGNOSIS — M7989 Other specified soft tissue disorders: Secondary | ICD-10-CM

## 2018-12-09 DIAGNOSIS — M79604 Pain in right leg: Secondary | ICD-10-CM | POA: Diagnosis present

## 2018-12-09 DIAGNOSIS — M79609 Pain in unspecified limb: Secondary | ICD-10-CM

## 2018-12-09 NOTE — Progress Notes (Signed)
Lower extremity venous has been completed.   Preliminary results in CV Proc.   Abram Sander 12/09/2018 2:46 PM

## 2018-12-09 NOTE — ED Notes (Signed)
Called pt x2 for vitals, no response. °

## 2018-12-09 NOTE — ED Triage Notes (Signed)
Patient reports possible DVT R lower leg - endorses pain with ambulation, swelling, redness, hot to touch for a few days. Reports history of DVT and states this feels the same, but worse. Currently not on blood thinner.

## 2018-12-09 NOTE — ED Notes (Signed)
No answer x3 for repeat vitals

## 2019-02-15 ENCOUNTER — Encounter: Payer: 59 | Admitting: Family

## 2019-02-15 ENCOUNTER — Encounter (HOSPITAL_COMMUNITY): Payer: 59

## 2019-02-16 ENCOUNTER — Other Ambulatory Visit: Payer: Self-pay

## 2019-02-16 DIAGNOSIS — I739 Peripheral vascular disease, unspecified: Secondary | ICD-10-CM

## 2019-02-17 ENCOUNTER — Ambulatory Visit (HOSPITAL_COMMUNITY)
Admission: RE | Admit: 2019-02-17 | Discharge: 2019-02-17 | Disposition: A | Discharge status: Home / Self Care | Payer: 59 | Source: Ambulatory Visit | Attending: Family | Admitting: Family

## 2019-02-17 ENCOUNTER — Ambulatory Visit (INDEPENDENT_AMBULATORY_CARE_PROVIDER_SITE_OTHER): Payer: 59 | Admitting: Family

## 2019-02-17 ENCOUNTER — Other Ambulatory Visit: Payer: Self-pay

## 2019-02-17 ENCOUNTER — Encounter: Payer: Self-pay | Admitting: Vascular Surgery

## 2019-02-17 ENCOUNTER — Encounter: Payer: Self-pay | Admitting: Family

## 2019-02-17 VITALS — BP 131/84 | HR 74 | Temp 96.8°F | Resp 14 | Ht 66.0 in | Wt 272.0 lb

## 2019-02-17 DIAGNOSIS — I739 Peripheral vascular disease, unspecified: Secondary | ICD-10-CM | POA: Insufficient documentation

## 2019-02-17 DIAGNOSIS — I872 Venous insufficiency (chronic) (peripheral): Secondary | ICD-10-CM | POA: Diagnosis not present

## 2019-02-17 NOTE — Patient Instructions (Signed)
To decrease swelling in your feet and legs: Elevate feet above slightly bent knees, feet above heart, overnight and 3-4 times per day for 20 minutes.    Chronic Venous Insufficiency Chronic venous insufficiency is a condition where the leg veins cannot effectively pump blood from the legs to the heart. This happens when the vein walls are either stretched, weakened, or damaged, or when the valves inside the vein are damaged. With the right treatment, you should be able to continue with an active life. This condition is also called venous stasis. What are the causes? Common causes of this condition include:  High blood pressure inside the veins (venous hypertension).  Sitting or standing too long, causing increased blood pressure in the leg veins.  A blood clot that blocks blood flow in a vein (deep vein thrombosis, DVT).  Inflammation of a vein (phlebitis) that causes a blood clot to form.  Tumors in the pelvis that cause blood to back up. What increases the risk? The following factors may make you more likely to develop this condition:  Having a family history of this condition.  Obesity.  Pregnancy.  Living without enough regular physical activity or exercise (sedentary lifestyle).  Smoking.  Having a job that requires long periods of standing or sitting in one place.  Being a certain age. Women in their 92s and 5s and men in their 43s are more likely to develop this condition. What are the signs or symptoms? Symptoms of this condition include:  Veins that are enlarged, bulging, or twisted (varicose veins).  Skin breakdown or ulcers.  Reddened skin or dark discoloration of skin on the leg between the knee and ankle.  Brown, smooth, tight, and painful skin just above the ankle, usually on the inside of the leg (lipodermatosclerosis).  Swelling of the legs. How is this diagnosed? This condition may be diagnosed based on:  Your medical history.  A physical exam.   Tests, such as: ? A procedure that creates an image of a blood vessel and nearby organs and provides information about blood flow through the blood vessel (duplex ultrasound). ? A procedure that tests blood flow (plethysmography). ? A procedure that looks at the veins using X-ray and dye (venogram). How is this treated? The goals of treatment are to help you return to an active life and to minimize pain or disability. Treatment depends on the severity of your condition, and it may include:  Wearing compression stockings. These can help relieve symptoms and help prevent your condition from getting worse. However, they do not cure the condition.  Sclerotherapy. This procedure involves an injection of a solution that shrinks damaged veins.  Surgery. This may involve: ? Removing a diseased vein (vein stripping). ? Cutting off blood flow through the vein (laser ablation surgery). ? Repairing or reconstructing a valve within the affected vein. Follow these instructions at home:      Wear compression stockings as told by your health care provider. These stockings help to prevent blood clots and reduce swelling in your legs.  Take over-the-counter and prescription medicines only as told by your health care provider.  Stay active by exercising, walking, or doing different activities. Ask your health care provider what activities are safe for you and how much exercise you need.  Drink enough fluid to keep your urine pale yellow.  Do not use any products that contain nicotine or tobacco, such as cigarettes, e-cigarettes, and chewing tobacco. If you need help quitting, ask your health care provider.  Keep all follow-up visits as told by your health care provider. This is important. Contact a health care provider if you:  Have redness, swelling, or more pain in the affected area.  See a red streak or line that goes up or down from the affected area.  Have skin breakdown or skin loss in the  affected area, even if the breakdown is small.  Get an injury in the affected area. Get help right away if:  You get an injury and an open wound in the affected area.  You have: ? Severe pain that does not get better with medicine. ? Sudden numbness or weakness in the foot or ankle below the affected area. ? Trouble moving your foot or ankle. ? A fever. ? Worse or persistent symptoms. ? Chest pain. ? Shortness of breath. Summary  Chronic venous insufficiency is a condition where the leg veins cannot effectively pump blood from the legs to the heart.  Chronic venous insufficiency occurs when the vein walls become stretched, weakened, or damaged, or when valves within the vein are damaged.  Treatment depends on how severe your condition is. It often involves wearing compression stockings and may involve having a procedure.  Make sure you stay active by exercising, walking, or doing different activities. Ask your health care provider what activities are safe for you and how much exercise you need. This information is not intended to replace advice given to you by your health care provider. Make sure you discuss any questions you have with your health care provider. Document Released: 06/29/2006 Document Revised: 11/16/2017 Document Reviewed: 11/16/2017 Elsevier Patient Education  2020 [employer].

## 2019-02-17 NOTE — Progress Notes (Signed)
Referred by:  Enid Skeens., MD [address],  [place] [id number]  Reason for referral: Swelling and occasional pain in both legs, hx of left leg DVT  History of Present Illness  Peggy Freeman is a 58 y.o. (Aug 09, 1960) female who presents with chief complaint: Swelling and occasional pain in both legs, hx of left leg DVT.   She has used knee high compression hose in the past.   She has a history of left leg DVT in 1990 and 1996, took warfarin for a couple years.She has Factor 5 Leiden mutation.   She states her brother has a hx of DVT, DM, ESRD, both legs amputated.  Her mother has a history of phlebitis with skin grafting.  She has a cousin with lymphedema, states her leg was amputated.   Pt states she does not have DM. Tobacco use: quit in 1998, smoked starting at age 71  She takes a daily 81 mg ASA. She does not take a statin  Past Medical History:  Diagnosis Date  . ADHD (attention deficit hyperactivity disorder)   . Blood dyscrasia    Factor V Leiden mutation  . Cancer ([place])    endometrial  . DVT (deep venous thrombosis) ([place])   . Factor V Leiden mutation ([place])   . Stress incontinence     Past Surgical History:  Procedure Laterality Date  . [place]  . LAPAROSCOPIC GASTRIC SLEEVE RESECTION  2011  . ROBOTIC ASSISTED TOTAL HYSTERECTOMY WITH BILATERAL SALPINGO OOPHERECTOMY Bilateral 11/06/2014   Procedure: ROBOTIC ASSISTED TOTAL HYSTERECTOMY WITH BILATERAL SALPINGO OOPHORECTOMY, SENTINEL LYMPH NODE BIOPSY, LYSIS OF ADHESIONS;  Surgeon: Everitt Amber, MD;  Location: [facility];  Service: Gynecology;  Laterality: Bilateral;  . TUBAL LIGATION  09/30/1989    Social History   Socioeconomic History  . Marital status: Married    Spouse name: Not on file  . Number of children: Not on file  . Years of education: Not on file  . Highest education level: Not on file  Occupational History  . Not on file  Tobacco Use  . Smoking status: Former  Smoker    Quit date: 03/10/1995    Years since quitting: 23.9  . Smokeless tobacco: Never Used  Substance and Sexual Activity  . Alcohol use: No  . Drug use: No  . Sexual activity: Not on file  Other Topics Concern  . Not on file  Social History Narrative  . Not on file   Social Determinants of Health   Financial Resource Strain:   . Difficulty of Paying Living Expenses: Not on file  Food Insecurity:   . Worried About Charity fundraiser in the Last Year: Not on file  . Ran Out of Food in the Last Year: Not on file  Transportation Needs:   . Lack of Transportation (Medical): Not on file  . Lack of Transportation (Non-Medical): Not on file  Physical Activity:   . Days of Exercise per Week: Not on file  . Minutes of Exercise per Session: Not on file  Stress:   . Feeling of Stress : Not on file  Social Connections:   . Frequency of Communication with Friends and Family: Not on file  . Frequency of Social Gatherings with Friends and Family: Not on file  . Attends Religious Services: Not on file  . Active Member of Clubs or Organizations: Not on file  . Attends Archivist Meetings: Not on file  .  Marital Status: Not on file  Intimate Partner Violence:   . Fear of Current or Ex-Partner: Not on file  . Emotionally Abused: Not on file  . Physically Abused: Not on file  . Sexually Abused: Not on file    Family History  Problem Relation Age of Onset  . Diabetes Mother   . Hypertension Mother   . Congestive Heart Failure Mother   . Diabetes Brother   . Heart disease Brother   . Kidney failure Brother   . Cancer Maternal Aunt   . Diabetes Paternal Grandmother     Current Outpatient Medications on File Prior to Visit  Medication Sig Dispense Refill  . aspirin 81 MG tablet Take 81 mg by mouth daily.     . Cholecalciferol (VITAMIN D3) 5000 UNITS CAPS Take 5,000 Units by mouth daily.    Marland Kitchen ibuprofen (ADVIL,MOTRIN) 200 MG tablet Take 400 mg by mouth every 8 (eight)  hours as needed for mild pain.     . methylphenidate 36 MG PO CR tablet Take 36 mg by mouth 2 (two) times daily.     . Cyanocobalamin ([employer] B-12) 1000 MCG/15ML LIQD Take 1 mL by mouth daily.    . Multiple Vitamins-Minerals (EMERGEN-C IMMUNE PLUS) PACK Take 1 Package by mouth daily.     No current facility-administered medications on file prior to visit.    No Known Allergies  REVIEW OF SYSTEMS: Cardiovascular: No chest pain, chest pressure, palpitations, orthopnea, or dyspnea on exertion. No claudication or rest pain, + history of DVT, ? Phlebitis,. + history cellulitis left leg Pulmonary: No productive cough, asthma or wheezing. Neurologic: No weakness, + occasional paresthesias in hands, aphasia, or amaurosis. + occasional dizziness. Hematologic:  + Factor 5 Leiden mutation. Musculoskeletal: No joint pain or joint swelling. Gastrointestinal: No blood in stool or hematemesis Genitourinary: No dysuria or hematuria. Psychiatric:: +history of major depression when she lost her son in 2017. Integumentary: No rashes or ulcers. Constitutional: No fever or chills.  Physical Examination Vitals:   02/17/19 1423  BP: 131/84  Pulse: 74  Resp: 14  Temp: (!) 96.8 F (36 C)  TempSrc: Temporal  SpO2: 99%  Weight: 272 lb (123.4 kg)  Height: 5\' 6"  (1.676 m)   Body mass index is 43.9 kg/m.  PHYSICAL EXAMINATION: General: Morbidly obese female in NAD HEENT:  No gross abnormalities Pulmonary: Respirations are non-labored, distant breath sounds, no rales, rhonchi, or wheezes Abdomen: Soft and non-tender with normal bowel sounds. Musculoskeletal: There are no major deformities.   Neurologic: No focal weakness or paresthesias are detected, muscle strength in extremities is 5/5 Skin: There are no ulcer or rashes noted. Hemosiderin deposits anterior left lower leg. 1+ pitting edema, and 1-2+ non pitting edema in bilateral lower legs.  Psychiatric: The patient has normal affect. Cardiovascular:  There is a regular rate and rhythm without significant murmur appreciated.   Vascular: Vessel Right Left  Radial Palpable Palpable  Carotid without bruit  without bruit  Aorta Not palpable N/A  Femoral Palpable Palpable  Popliteal Not palpable Not palpable  PT Not Palpable Not Palpable  DP Not Palpable faintlyPalpable   Non-Invasive Vascular Imaging  BLE Venous Duplex (Date: 02/17/2019):  Venous Reflux Times +--------------+------+-----------+------------+----------+ RIGHT         RefluxReflux TimeDiameter cmsComments                  Yes                                    +--------------+------+-----------+------------+----------+  CFV            yes     1320                           +--------------+------+-----------+------------+----------+ GSV at SFJ                         0.73               +--------------+------+-----------+------------+----------+ GSV prox thigh yes     4848        0.52               +--------------+------+-----------+------------+----------+ GSV mid thigh  yes     4973        0.55    out fascia +--------------+------+-----------+------------+----------+ GSV dist thigh                     0.45    in fascia  +--------------+------+-----------+------------+----------+ GSV at knee                        0.43    tortuous   +--------------+------+-----------+------------+----------+ GSV prox calf                              NV         +--------------+------+-----------+------------+----------+ GSV mid calf                               NV         +--------------+------+-----------+------------+----------+ SSV Pop Fossa  yes     1650        0.43               +--------------+------+-----------+------------+----------+ SSV prox calf                      0.38               +--------------+------+-----------+------------+----------+ SSV mid calf                       0.32                +--------------+------+-----------+------------+----------+    +--------------+------+-----------+------------+--------+ LEFT          RefluxReflux TimeDiameter cmsComments                Yes                                  +--------------+------+-----------+------------+--------+ CFV            yes     4085                         +--------------+------+-----------+------------+--------+ FV mid         yes     3792                         +--------------+------+-----------+------------+--------+ Popliteal      yes     2288                         +--------------+------+-----------+------------+--------+ GSV at [facility]  yes     2208        0.73             +--------------+------+-----------+------------+--------+ GSV prox thigh                     0.76             +--------------+------+-----------+------------+--------+ GSV mid thigh                      0.32             +--------------+------+-----------+------------+--------+ GSV dist thigh                     0.26             +--------------+------+-----------+------------+--------+ GSV at knee                                nv       +--------------+------+-----------+------------+--------+ GSV prox calf                      0.33             +--------------+------+-----------+------------+--------+ SSV Pop Fossa  yes     1130        0.58             +--------------+------+-----------+------------+--------+ SSV prox calf  yes     1093        0.33             +--------------+------+-----------+------------+--------+ SSV mid calf                       0.25             +--------------+------+-----------+------------+--------+ Summary: Right: There is no evidence of acute deep vein thrombosis in the lower extremity from the common femoral vein to the popliteal vein. Reflux in the common femoral vein, greater saphenous vein in the proximal to mid  thigh. and the small saphenous vein in the popliteal fossa.  Left: There is no evidence of deep vein thrombosis in the lower extremity from the common femoral vein to the popliteal vein. Reflux in the common femoral vein, femoral vein and the popliteal vein. Reflux at the saphenofemoral junctin. Reflux in the small saphenous vein in the popliteal fossa and proximal calf.     Right LE Venous Duplex (12-09-18), [facility], right leg swelling and pain: +---------+---------------+---------+-----------+----------+------------------+  RIGHT  CompressibilityPhasicitySpontaneityPropertiesThrombus Aging    +---------+---------------+---------+-----------+----------+------------------+  CFV   Full      Yes   Yes                    +---------+---------------+---------+-----------+----------+------------------+  SFJ   Full                                 +---------+---------------+---------+-----------+----------+------------------+  FV Prox Full                                 +---------+---------------+---------+-----------+----------+------------------+  FV Mid  Full                                 +---------+---------------+---------+-----------+----------+------------------+  FV DistalFull                                 +---------+---------------+---------+-----------+----------+------------------+  PFV   Full                                 +---------+---------------+---------+-----------+----------+------------------+  POP   Full      Yes   Yes                    +---------+---------------+---------+-----------+----------+------------------+  PTV   Full                                   +---------+---------------+---------+-----------+----------+------------------+  PERO   Full                      limited                                     visualization     +---------+---------------+---------+-----------+----------+------------------+  Soleal                          Not visualized    +---------+---------------+---------+-----------+----------+------------------+  Summary:  Right: There is no evidence of deep vein thrombosis in the lower extremity. However, portions of this examination were limited- see technologist comments above. No cystic structure found in the popliteal fossa.     Other facility Studies/Documentation 5 pages of outside documents were reviewed including: 01-03-19 clinic note from her PCP.  Medical Decision Making  Peggy Freeman is a 58 y.o. female who presents with: recent occasional pain and edema in right lower leg, left lower leg with edema since 1990 first DVT, 2nd DVT in 1996. She has Factor 5 Leiden mutation, took warfarin for 2 years after her DVT's.  Right lower leg with taught skin, larger than left lower leg, 1-2+ pitting and non pitting edema, hemosiderin deposits in left lower leg.  No DVT on venous LE duplex today, + reflux in several veins of both legs.  Pedal pulses are no palpable, likely due to edema in feet, no signs of ischemia, legs feel better the more she walks. Will obtain ABI's on her return in 3 months.   Based on the patient's history and examination, I recommend: avoid prolonged periods of sitting or standing, regular exercise, elevation of feet above heart whenever possible, and thigh high compression hose.  Pt was measured for thigh high compression hose, given the measurements, and given printed information re ETI.   I discussed with the patient the use of her 20-30 mm thigh high compression stockings and need for 3 month  trial of such.  The patient will follow up in 3 months with Dr. Scot Dock or Dr. Oneida Alar in the [facility].  Thank you for allowing [place] to participate in this patient's care.  Clemon Chambers, RN, MSN, FNP-C [facility] [place] Office: [phone]  02/17/2019, 2:55 PM  Clinic MD: Donzetta Matters on call

## 2019-03-07 ENCOUNTER — Other Ambulatory Visit: Payer: Self-pay | Admitting: *Deleted

## 2019-03-07 DIAGNOSIS — I739 Peripheral vascular disease, unspecified: Secondary | ICD-10-CM

## 2019-05-25 ENCOUNTER — Inpatient Hospital Stay (HOSPITAL_COMMUNITY): Admission: RE | Admit: 2019-05-25 | Payer: 59 | Source: Ambulatory Visit

## 2019-05-25 ENCOUNTER — Ambulatory Visit: Payer: 59 | Admitting: Vascular Surgery

## 2019-07-12 ENCOUNTER — Telehealth (HOSPITAL_COMMUNITY): Payer: Self-pay

## 2019-07-12 NOTE — Telephone Encounter (Signed)

## 2019-07-13 ENCOUNTER — Ambulatory Visit (INDEPENDENT_AMBULATORY_CARE_PROVIDER_SITE_OTHER): Payer: 59 | Admitting: Vascular Surgery

## 2019-07-13 ENCOUNTER — Encounter: Payer: Self-pay | Admitting: Vascular Surgery

## 2019-07-13 ENCOUNTER — Ambulatory Visit (HOSPITAL_COMMUNITY)
Admission: RE | Admit: 2019-07-13 | Discharge: 2019-07-13 | Disposition: A | Discharge status: Home / Self Care | Payer: 59 | Source: Ambulatory Visit | Attending: Vascular Surgery | Admitting: Vascular Surgery

## 2019-07-13 ENCOUNTER — Other Ambulatory Visit: Payer: Self-pay

## 2019-07-13 VITALS — BP 123/79 | HR 82 | Temp 97.6°F | Resp 16 | Ht 66.0 in | Wt 273.2 lb

## 2019-07-13 DIAGNOSIS — I739 Peripheral vascular disease, unspecified: Secondary | ICD-10-CM

## 2019-07-13 DIAGNOSIS — I890 Lymphedema, not elsewhere classified: Secondary | ICD-10-CM

## 2019-07-13 DIAGNOSIS — I872 Venous insufficiency (chronic) (peripheral): Secondary | ICD-10-CM

## 2019-07-13 NOTE — Progress Notes (Addendum)
MEDICAL ISSUES:   CHRONIC VENOUS INSUFFICIENCY AND LYMPHEDEMA: This patient has evidence of chronic venous insufficiency on the left with hyperpigmentation.  She had previous DVT on the left and has significant deep venous reflux that explains her hyperpigmentation.  On the right side she has significant swelling and could potentially have some underlying lymphedema.  We have discussed the importance of intermittent leg elevation and the proper positioning for this.  I have encouraged her to wear her compression stockings although it may be more practical to wear a knee-high 15-20 stocking.  I have encouraged her to avoid prolonged sitting and standing.  We discussed the importance of exercise specifically walking and water aerobics.  Fortunately she does have a pool.  In addition we discussed the importance of maintaining a healthy weight as central obesity especially increases lower extremity venous pressure.  If her swelling worsens on the right I think she may be a candidate for a pneumatic compression device.  I plan on seeing her back in 6 months with a follow-up venous study at that time.  She knows to call sooner if she has problems. She does not have any superficial venous disease amenable to laser angioplasty.   REASON FOR VISIT:   Follow-up of chronic venous disease.  HPI:   Peggy Freeman is a pleasant 59 y.o. female was seen by the nurse practitioner on 02/17/2019 with swelling and pain in both legs.  She has a history of a left lower extremity DVT.  She had a DVT in 1990 and also in 1996.  She was on Coumadin.  She has a factor V Leiden mutation.  On my history, she has some aching pain in her legs with standing there is relieved with elevation.  Her compression stockings help some.  She had swelling in both legs.  I do not get any history of claudication or rest pain.  Past Medical History:  Diagnosis Date  . ADHD (attention deficit hyperactivity disorder)   . Blood dyscrasia     Factor V Leiden mutation  . Cancer ([place])    endometrial  . DVT (deep venous thrombosis) ([place])   . Factor V Leiden mutation ([place])   . Stress incontinence     Family History  Problem Relation Age of Onset  . Diabetes Mother   . Hypertension Mother   . Congestive Heart Failure Mother   . Diabetes Brother   . Heart disease Brother   . Kidney failure Brother   . Cancer Maternal Aunt   . Diabetes Paternal Grandmother     SOCIAL HISTORY: Social History   Tobacco Use  . Smoking status: Former Smoker    Quit date: 03/10/1995    Years since quitting: 24.3  . Smokeless tobacco: Never Used  Substance Use Topics  . Alcohol use: No    No Known Allergies  Current Outpatient Medications  Medication Sig Dispense Refill  . aspirin 81 MG tablet Take 81 mg by mouth daily.     . Cholecalciferol (VITAMIN D3) 5000 UNITS CAPS Take 5,000 Units by mouth daily.    . Cyanocobalamin ([employer]S B-12) 1000 MCG/15ML LIQD Take 1 mL by mouth daily.    Marland Kitchen ibuprofen (ADVIL,MOTRIN) 200 MG tablet Take 400 mg by mouth every 8 (eight) hours as needed for mild pain.     . methylphenidate 36 MG PO CR tablet Take 36 mg by mouth 2 (two) times daily.     . Multiple Vitamins-Minerals (EMERGEN-C IMMUNE PLUS) PACK Take 1 Package  by mouth daily.     No current facility-administered medications for this visit.    REVIEW OF SYSTEMS:  [X]  denotes positive finding, [ ]  denotes negative finding Cardiac  Comments:  Chest pain or chest pressure:    Shortness of breath upon exertion:    Short of breath when lying flat:    Irregular heart rhythm:        Vascular    Pain in calf, thigh, or hip brought on by ambulation:    Pain in feet at night that wakes you up from your sleep:     Blood clot in your veins:    Leg swelling:  x       Pulmonary    Oxygen at home:    Productive cough:     Wheezing:         Neurologic    Sudden weakness in arms or legs:     Sudden numbness in arms or legs:     Sudden onset of  difficulty speaking or slurred speech:    Temporary loss of vision in one eye:     Problems with dizziness:         Gastrointestinal    Blood in stool:     Vomited blood:         Genitourinary    Burning when urinating:     Blood in urine:        Psychiatric    Major depression:         Hematologic    Bleeding problems:    Problems with blood clotting too easily:        Skin    Rashes or ulcers:        Constitutional    Fever or chills:     PHYSICAL EXAM:   Vitals:   07/13/19 1352  BP: 123/79  Pulse: 82  Resp: 16  Temp: 97.6 F (36.4 C)  TempSrc: Temporal  SpO2: 100%  Weight: 273 lb 3.2 oz (123.9 kg)  Height: 5\' 6"  (1.676 m)   Body mass index is 44.1 kg/m.   GENERAL: The patient is a well-nourished female, in no acute distress. The vital signs are documented above. CARDIAC: There is a regular rate and rhythm.  VASCULAR: I do not detect carotid bruits. She has palpable pedal pulses. She has bilateral lower extremity swelling which is more significant on the right side. She has hyperpigmentation on the left leg. RIGHT LEG: Calf 48 cm, ankle 27 cm.   LEFT LEG: Calf 43 cm, ankle 25 cm. PULMONARY: There is good air exchange bilaterally without wheezing or rales. ABDOMEN: Soft and non-tender with normal pitched bowel sounds.  MUSCULOSKELETAL: There are no major deformities or cyanosis. NEUROLOGIC: No focal weakness or paresthesias are detected. SKIN: There are no ulcers or rashes noted. PSYCHIATRIC: The patient has a normal affect.  DATA:    VENOUS DUPLEX: I have reviewed her venous duplex scan from her previous visit.  On the right side there is no evidence of DVT.  There is deep venous reflux involving the common femoral vein.  There is no significant superficial venous reflux.  The saphenofemoral junction is competent.  There is an incompetent segment of the great saphenous vein in the thigh but this is outside the fascia.  On the left side, there is no  evidence of DVT.  There is significant deep venous reflux involving the common femoral vein, femoral vein, and popliteal vein.  There is saphenous reflux at the saphenofemoral  junction only.  ARTERIAL DOPPLER STUDY: I have independently interpreted her arterial Doppler studies today.  On the right side there is a triphasic dorsalis pedis and posterior tibial signal with an ABI of 100% and a normal toe pressure.  On the left side there is a triphasic dorsalis pedis and posterior tibial signal with an ABI of 100% and a normal toe pressure.  Deitra Mayo [facility] [phone]

## 2019-07-17 ENCOUNTER — Other Ambulatory Visit: Payer: Self-pay | Admitting: *Deleted

## 2019-07-17 DIAGNOSIS — I890 Lymphedema, not elsewhere classified: Secondary | ICD-10-CM

## 2019-07-17 DIAGNOSIS — I872 Venous insufficiency (chronic) (peripheral): Secondary | ICD-10-CM

## 2019-08-01 ENCOUNTER — Encounter: Payer: Self-pay | Admitting: Cardiology

## 2019-08-01 ENCOUNTER — Ambulatory Visit (INDEPENDENT_AMBULATORY_CARE_PROVIDER_SITE_OTHER): Payer: 59 | Admitting: Cardiology

## 2019-08-01 ENCOUNTER — Other Ambulatory Visit: Payer: Self-pay

## 2019-08-01 DIAGNOSIS — R002 Palpitations: Secondary | ICD-10-CM

## 2019-08-01 DIAGNOSIS — R42 Dizziness and giddiness: Secondary | ICD-10-CM

## 2019-08-01 DIAGNOSIS — N393 Stress incontinence (female) (male): Secondary | ICD-10-CM | POA: Insufficient documentation

## 2019-08-01 DIAGNOSIS — D6851 Activated protein C resistance: Secondary | ICD-10-CM | POA: Insufficient documentation

## 2019-08-01 DIAGNOSIS — I951 Orthostatic hypotension: Secondary | ICD-10-CM | POA: Insufficient documentation

## 2019-08-01 DIAGNOSIS — I82409 Acute embolism and thrombosis of unspecified deep veins of unspecified lower extremity: Secondary | ICD-10-CM | POA: Insufficient documentation

## 2019-08-01 NOTE — Progress Notes (Signed)
Cardiology Office Note:    Date:  08/01/2019   ID:  Peggy Freeman, DOB Aug 02, 1960, MRN [id number]  PCP:  Peggy Skeens., MD  Cardiologist:  Peggy Lindau, MD   Referring MD: Peggy Skeens., MD    ASSESSMENT:    1. Palpitations   2. Postural hypotension   3. Dizziness   4. Morbid obesity ([place])    PLAN:    In order of problems listed above:  1. Primary prevention stressed with the patient.  Importance of compliance with diet and medication stressed and she vocalized understanding. 2. Morbid obesity: Diet was mentioned and reported regular exercise stressed numbers of obesity explained and she plans to do better. 3. Her symptoms are very vague.  EKG unremarkable.  I reviewed records from primary care physician's office.  I told her to keep her self well-hydrated.  She has stopped using Wellbutrin and a lot of her symptoms have gotten better and she is encouraged about it. 4. Cardiac murmur: Echocardiogram will be done to assess this.  She knows to go to nearest emergency room  for any concerning symptoms. 5. Patient will be seen in follow-up appointment in 6 months or earlier if the patient has any concerns    Medication Adjustments/Labs and Tests Ordered: Current medicines are reviewed at length with the patient today.  Concerns regarding medicines are outlined above.  Orders Placed This Encounter  Procedures  . EKG 12-Lead  . ECHOCARDIOGRAM COMPLETE   No orders of the defined types were placed in this encounter.    History of Present Illness:    Peggy Freeman is a 59 y.o. female who is being seen today for the evaluation of dizzy spells at the request of [place], Marshall Cork., MD.  Patient is a pleasant 59 year old female.  She has no significant past medical history.  She mentions to me that she was initiated on Wellbutrin and she was really dizzy whenever she changed her posture.  She stopped using Wellbutrin and feels much better.  No chest orthopnea or PND.  She  leads a sedentary lifestyle.  She denies any chest pain.  At the time of my evaluation, the patient is alert awake oriented and in no distress.  Past Medical History:  Diagnosis Date  . ADHD (attention deficit hyperactivity disorder)   . Blood dyscrasia    Factor V Leiden mutation  . Cancer ([place])    endometrial  . DVT (deep venous thrombosis) ([place])   . Factor V Leiden mutation ([place])   . Stress incontinence     Past Surgical History:  Procedure Laterality Date  . [place]  . LAPAROSCOPIC GASTRIC SLEEVE RESECTION  2011  . ROBOTIC ASSISTED TOTAL HYSTERECTOMY WITH BILATERAL SALPINGO OOPHERECTOMY Bilateral 11/06/2014   Procedure: ROBOTIC ASSISTED TOTAL HYSTERECTOMY WITH BILATERAL SALPINGO OOPHORECTOMY, SENTINEL LYMPH NODE BIOPSY, LYSIS OF ADHESIONS;  Surgeon: Peggy Amber, MD;  Location: [facility];  Service: Gynecology;  Laterality: Bilateral;  . TUBAL LIGATION  09/30/1989    Current Medications: Current Meds  Medication Sig  . aspirin 81 MG tablet Take 81 mg by mouth daily.   . Cholecalciferol (VITAMIN D3) 5000 UNITS CAPS Take 5,000 Units by mouth daily.  . Cyanocobalamin ([employer] B-12) 1000 MCG/15ML LIQD Take 1 mL by mouth daily.  . furosemide (LASIX) 20 MG tablet furosemide 20 mg tablet  TAKE 1 TO 2 TABLETS BY MOUTH DAILY IN THE MORNING FOR SWELLING  . ibuprofen (ADVIL,MOTRIN) 200 MG tablet Take 400 mg by  mouth every 8 (eight) hours as needed for mild pain.   . Multiple Vitamins-Minerals (EMERGEN-C IMMUNE PLUS) PACK Take 1 Package by mouth daily.     Allergies:   Patient has no known allergies.   Social History   Socioeconomic History  . Marital status: Married    Spouse name: Not on file  . Number of children: Not on file  . Years of education: Not on file  . Highest education level: Not on file  Occupational History  . Not on file  Tobacco Use  . Smoking status: Former Smoker    Quit date: 03/10/1995    Years since quitting: 24.4  . Smokeless tobacco:  Never Used  Substance and Sexual Activity  . Alcohol use: No  . Drug use: No  . Sexual activity: Not on file  Other Topics Concern  . Not on file  Social History Narrative  . Not on file   Social Determinants of Health   Financial Resource Strain:   . Difficulty of Paying Living Expenses:   Food Insecurity:   . Worried About Charity fundraiser in the Last Year:   . Arboriculturist in the Last Year:   Transportation Needs:   . Film/video editor (Medical):   Peggy Freeman Kitchen Lack of Transportation (Non-Medical):   Physical Activity:   . Days of Exercise per Week:   . Minutes of Exercise per Session:   Stress:   . Feeling of Stress :   Social Connections:   . Frequency of Communication with Friends and Family:   . Frequency of Social Gatherings with Friends and Family:   . Attends Religious Services:   . Active Member of Clubs or Organizations:   . Attends Archivist Meetings:   Peggy Freeman Kitchen Marital Status:      Family History: The patient's family history includes Cancer in her maternal aunt; Congestive Heart Failure in her mother; Diabetes in her brother, mother, and paternal grandmother; Heart disease in her brother; Hypertension in her mother; Kidney failure in her brother.  ROS:   Please see the history of present illness.    All other systems reviewed and are negative.  EKGs/Labs/Other Studies Reviewed:    The following studies were reviewed today: Summary:  Right: Resting right ankle-brachial index is within normal range. No  evidence of significant right lower extremity arterial disease. The right  toe-brachial index is normal. RT great toe pressure = 102 mmHg.   Left: Resting left ankle-brachial index is within normal range. No  evidence of significant left lower extremity arterial disease. The left  toe-brachial index is normal. LT Great toe pressure = 99 mmHg.   EKG reveals sinus rhythm and nonspecific ST-T changes.   Recent Labs: No results found for requested  labs within last 8760 hours.  Recent Lipid Panel No results found for: CHOL, TRIG, HDL, CHOLHDL, VLDL, LDLCALC, LDLDIRECT  Physical Exam:    VS:  BP 126/80   Pulse 88   Ht 5\' 6"  (1.676 m)   Wt 270 lb (122.5 kg)   SpO2 99%   BMI 43.58 kg/m     Wt Readings from Last 3 Encounters:  08/01/19 270 lb (122.5 kg)  07/13/19 273 lb 3.2 oz (123.9 kg)  02/17/19 272 lb (123.4 kg)     GEN: Patient is in no acute distress HEENT: Normal NECK: No JVD; No carotid bruits LYMPHATICS: No lymphadenopathy CARDIAC: S1 S2 regular, 2/6 systolic murmur at the apex. RESPIRATORY:  Clear to auscultation  without rales, wheezing or rhonchi  ABDOMEN: Soft, non-tender, non-distended MUSCULOSKELETAL:  No edema; No deformity  SKIN: Warm and dry NEUROLOGIC:  Alert and oriented x 3 PSYCHIATRIC:  Normal affect    Signed, Peggy Lindau, MD  08/01/2019 2:45 PM    [facility]

## 2019-08-01 NOTE — Patient Instructions (Signed)
Medication Instructions:  No medication changes. *If you need a refill on your cardiac medications before your next appointment, please call your pharmacy*   Lab Work: None ordered If you have labs (blood work) drawn today and your tests are completely normal, you will receive your results only by: . MyChart Message (if you have MyChart) OR . A paper copy in the mail If you have any lab test that is abnormal or we need to change your treatment, we will call you to review the results.   Testing/Procedures: Your physician has requested that you have an echocardiogram. Echocardiography is a painless test that uses sound waves to create images of your heart. It provides your doctor with information about the size and shape of your heart and how well your heart's chambers and valves are working. This procedure takes approximately one hour. There are no restrictions for this procedure.   Follow-Up: At [facility], you and your health needs are our priority.  As part of our continuing mission to provide you with exceptional heart care, we have created designated Provider Care Teams.  These Care Teams include your primary Cardiologist (physician) and Advanced Practice Providers (APPs -  Physician Assistants and Nurse Practitioners) who all work together to provide you with the care you need, when you need it.  We recommend signing up for the patient portal called "MyChart".  Sign up information is provided on this After Visit Summary.  MyChart is used to connect with patients for Virtual Visits (Telemedicine).  Patients are able to view lab/test results, encounter notes, upcoming appointments, etc.  Non-urgent messages can be sent to your provider as well.   To learn more about what you can do with MyChart, go to [email or web address]://[email or web address].com.    Your next appointment:   3 month(s)  The format for your next appointment:   In Person  Provider:   Rajan Revankar, MD   Other  Instructions   Echocardiogram An echocardiogram is a procedure that uses painless sound waves (ultrasound) to produce an image of the heart. Images from an echocardiogram can provide important information about:  Signs of coronary artery disease (CAD).  Aneurysm detection. An aneurysm is a weak or damaged part of an artery wall that bulges out from the normal force of blood pumping through the body.  Heart size and shape. Changes in the size or shape of the heart can be associated with certain conditions, including heart failure, aneurysm, and CAD.  Heart muscle function.  Heart valve function.  Signs of a past heart attack.  Fluid buildup around the heart.  Thickening of the heart muscle.  A tumor or infectious growth around the heart valves. Tell a health care provider about:  Any allergies you have.  All medicines you are taking, including vitamins, herbs, eye drops, creams, and over-the-counter medicines.  Any blood disorders you have.  Any surgeries you have had.  Any medical conditions you have.  Whether you are pregnant or may be pregnant. What are the risks? Generally, this is a safe procedure. However, problems may occur, including:  Allergic reaction to dye (contrast) that may be used during the procedure. What happens before the procedure? No specific preparation is needed. You may eat and drink normally. What happens during the procedure?    An IV tube may be inserted into one of your veins.  You may receive contrast through this tube. A contrast is an injection that improves the quality of the pictures from your heart.  A   gel will be applied to your chest.  A wand-like tool (transducer) will be moved over your chest. The gel will help to transmit the sound waves from the transducer.  The sound waves will harmlessly bounce off of your heart to allow the heart images to be captured in real-time motion. The images will be recorded on a computer. The  procedure may vary among health care providers and hospitals. What happens after the procedure?  You may return to your normal, everyday life, including diet, activities, and medicines, unless your health care provider tells you not to do that. Summary  An echocardiogram is a procedure that uses painless sound waves (ultrasound) to produce an image of the heart.  Images from an echocardiogram can provide important information about the size and shape of your heart, heart muscle function, heart valve function, and fluid buildup around your heart.  You do not need to do anything to prepare before this procedure. You may eat and drink normally.  After the echocardiogram is completed, you may return to your normal, everyday life, unless your health care provider tells you not to do that. This information is not intended to replace advice given to you by your health care provider. Make sure you discuss any questions you have with your health care provider. Document Revised: 06/16/2018 Document Reviewed: 03/28/2016 Elsevier Patient Education  2020 Elsevier Inc.  

## 2019-08-18 ENCOUNTER — Ambulatory Visit (INDEPENDENT_AMBULATORY_CARE_PROVIDER_SITE_OTHER): Payer: 59

## 2019-08-18 ENCOUNTER — Other Ambulatory Visit: Payer: Self-pay

## 2019-08-18 DIAGNOSIS — R002 Palpitations: Secondary | ICD-10-CM | POA: Diagnosis not present

## 2019-08-18 NOTE — Progress Notes (Signed)
Complete echocardiogram performed.  Jimmy Jobanny Mavis RDCS, RVT  

## 2019-11-08 ENCOUNTER — Ambulatory Visit: Payer: 59 | Admitting: Cardiology

## 2021-03-12 ENCOUNTER — Telehealth: Payer: Self-pay | Admitting: Cardiology

## 2021-03-12 NOTE — Telephone Encounter (Signed)
Revankar, Reita Cliche, MD  You 11 minutes ago (4:45 PM)   Go to the emergency room for any significant symptoms.  Make elective appointment with me.

## 2021-03-12 NOTE — Telephone Encounter (Signed)
Attempted to call pt back. No answer. Lmtcb.   Pt last seen in office 08/01/19 with Dr. Geraldo Pitter.  Pt to follow up 3 months.  No show for appt 11/08/19.  Unremarkable echo 08/18/19.   Will discuss s/s with pt upon return call.

## 2021-03-12 NOTE — Telephone Encounter (Signed)
Spoke with pt.  Pt reports rapid HR 140-145 "while walking through the grocery store" last week.  Pt monitors HR via her smart watch. Also notes resting HR was ~105 last week as well.  Tightness in chest while HR in 140s.  Left arm numbness "comes and goes." Pt did have episode of elevated HR yesterday and had "some shortness of breath."  Pt reports that episodes last week were much more intense than episode yesterday.  Pt denies any chest pain, SOB, or elevated HR now at time of call. HR 76-80 now. Pt states this is the first occurrence of these type of symptoms.   Pt last seen in office 08/01/19 with Dr. Geraldo Pitter.  Pt did not follow up per recc of 3 months.   Asked pt if she feels she needs to be evaluated today and pt states she has no current symptoms, HR is normal, and pt feels stable at this time.  Advised pt that I will notify Dr. Julien Nordmann team to schedule a follow up appointment to be seen in the office.  Otherwise, advised pt to call 911 or go to the emergency room if she has recurrent symptoms of chest tightness, shortness of breath, left arm pain/numbness or becomes unstable.   Pt voiced understanding and has no further questions at this time.  Pt aware that Dr. Julien Nordmann office will be in contact with her to schedule appointment.

## 2021-03-12 NOTE — Telephone Encounter (Signed)
Pt c/o Shortness Of Breath: STAT if SOB developed within the last 24 hours or pt is noticeably SOB on the phone  1. Are you currently SOB (can you hear that pt is SOB on the phone)? No   2. How long have you been experiencing SOB? yesterday  3. Are you SOB when sitting or when up moving around? Moving around  4. Are you currently experiencing any other symptoms? Pain in left arm; fatigue

## 2023-11-04 ENCOUNTER — Encounter: Payer: Self-pay | Admitting: Gastroenterology

## 2023-12-14 ENCOUNTER — Encounter: Payer: Self-pay | Admitting: Gastroenterology

## 2023-12-14 ENCOUNTER — Ambulatory Visit: Admitting: *Deleted

## 2023-12-14 VITALS — Ht 66.0 in | Wt 204.0 lb

## 2023-12-14 DIAGNOSIS — Z83719 Family history of colon polyps, unspecified: Secondary | ICD-10-CM

## 2023-12-14 DIAGNOSIS — R195 Other fecal abnormalities: Secondary | ICD-10-CM

## 2023-12-14 MED ORDER — NA SULFATE-K SULFATE-MG SULF 17.5-3.13-1.6 GM/177ML PO SOLN: 1.0000 | Freq: Once | ORAL | 0 refills | Status: AC

## 2023-12-14 NOTE — Progress Notes (Signed)
 Pt's name and DOB verified at the beginning of the pre-visit with 2 identifiers  Pt denies any difficulty with ambulating,sitting, laying down or rolling side to side  Pt has no issues moving head neck or swallowing  No egg or soy allergy known to patient   No issues known to pt with past sedation  No FH of Malignant Hyperthermia  Pt is not on home 02   Pt is not on blood thinners   Pt denies issues with constipation   Pt is not on dialysis  Pt denise any abnormal heart rhythms   Pt denies any upcoming cardiac testing  Patient's chart reviewed by Norleen Schillings CNRA prior to pre-visit and patient appropriate for the [facility].  Pre-visit completed and red dot placed by patient's name on their procedure day (on provider's schedule).    Visit by phone  Pt states weight is 204 lb   Pt given  both [facility] main # and MD on call # prior to instructions.  Informed pt to come in at the time discussed and is shown on PV instructions.  Pt instructed to use Single[employer].com or GoodRx for a price reduction on prep  Instructed pt where to find PV instructions in My Ch. Copy of instructions  to be sent in mail and address read back to pt to verify correct on envelope. Instructed pt on all aspects of written instructions including med holds clothing to wear and foods to eat and not eat as well as after procedure legal restrictions and to call MD on call if needed.. Pt states understanding. Instructed pt to review instructions again prior to procedure and call main # given if has any questions or any issues. Pt states they will.

## 2024-01-04 ENCOUNTER — Ambulatory Visit (AMBULATORY_SURGERY_CENTER): Admitting: Gastroenterology

## 2024-01-04 ENCOUNTER — Encounter: Payer: Self-pay | Admitting: Gastroenterology

## 2024-01-04 VITALS — BP 114/64 | HR 67 | Temp 97.8°F | Resp 15 | Ht 66.0 in | Wt 204.0 lb

## 2024-01-04 DIAGNOSIS — K641 Second degree hemorrhoids: Secondary | ICD-10-CM | POA: Diagnosis not present

## 2024-01-04 DIAGNOSIS — K5730 Diverticulosis of large intestine without perforation or abscess without bleeding: Secondary | ICD-10-CM | POA: Diagnosis not present

## 2024-01-04 DIAGNOSIS — R195 Other fecal abnormalities: Secondary | ICD-10-CM

## 2024-01-04 DIAGNOSIS — K635 Polyp of colon: Secondary | ICD-10-CM | POA: Diagnosis not present

## 2024-01-04 DIAGNOSIS — Z1211 Encounter for screening for malignant neoplasm of colon: Secondary | ICD-10-CM

## 2024-01-04 DIAGNOSIS — D122 Benign neoplasm of ascending colon: Secondary | ICD-10-CM

## 2024-01-04 DIAGNOSIS — Z83719 Family history of colon polyps, unspecified: Secondary | ICD-10-CM

## 2024-01-04 MED ORDER — SODIUM CHLORIDE 0.9 % IV SOLN: 500.0000 mL | Freq: Once | INTRAVENOUS | Status: DC

## 2024-01-04 NOTE — Progress Notes (Signed)
 Pt's states no medical or surgical changes since previsit or office visit.

## 2024-01-04 NOTE — Op Note (Signed)
 [facility] Patient Name: Peggy Freeman Procedure Date: 01/04/2024 9:23 AM MRN: [id number] Endoscopist: Lynnie Bring , MD, [id number] Age: 63 Referring MD:  Date of Birth: May 19, 1960 Gender: Female Account #: [id number] Procedure:                Colonoscopy Indications:              Positive Cologuard test Medicines:                Monitored Anesthesia Care Procedure:                Pre-Anesthesia Assessment:                           - Prior to the procedure, a History and Physical                            was performed, and patient medications and                            allergies were reviewed. The patient's tolerance of                            previous anesthesia was also reviewed. The risks                            and benefits of the procedure and the sedation                            options and risks were discussed with the patient.                            All questions were answered, and informed consent                            was obtained. Prior Anticoagulants: The patient has                            taken no anticoagulant or antiplatelet agents. ASA                            Grade Assessment: II - A patient with mild systemic                            disease. After reviewing the risks and benefits,                            the patient was deemed in satisfactory condition to                            undergo the procedure.                           After obtaining informed consent, the colonoscope  was passed under direct vision. Throughout the                            procedure, the patient's blood pressure, pulse, and                            oxygen saturations were monitored continuously. The                            CF [id number] #[id number] was introduced through the anus                            and advanced to the the cecum, identified by                            appendiceal orifice and ileocecal  valve. The                            colonoscopy was performed without difficulty. The                            patient tolerated the procedure well. The quality                            of the bowel preparation was good. The ileocecal                            valve, appendiceal orifice, and rectum were                            photographed. Scope In: 9:31:37 AM Scope Out: 9:47:57 AM Scope Withdrawal Time: 0 hours 11 minutes 29 seconds  Total Procedure Duration: 0 hours 16 minutes 20 seconds  Findings:                 A 20 mm polyp was found in the mid ascending colon.                            The polyp was sessile. The polyp was removed with a                            piecemeal technique using a cold snare. Resection                            and retrieval were complete. Estimated blood loss:                            none.                           Multiple medium-mouthed diverticula were found in                            the sigmoid colon, few in descending colon and  ascending colon.                           Non-bleeding internal hemorrhoids were found during                            retroflexion. The hemorrhoids were moderate and                            Grade II (internal hemorrhoids that prolapse but                            reduce spontaneously).                           Retroflexion in the right colon was performed.                           The exam was otherwise without abnormality on                            direct and retroflexion views. Complications:            No immediate complications. Estimated Blood Loss:     Estimated blood loss: none. Impression:               - One 20 mm polyp in the mid ascending colon,                            removed piecemeal using a cold snare. Resected and                            retrieved.                           - Pancolonic diverticulosis predominantly in                             sigmoid colon.                           - Non-bleeding internal hemorrhoids.                           - The examination was otherwise normal on direct                            and retroflexion views. Recommendation:           - Patient has a contact number available for                            emergencies. The signs and symptoms of potential                            delayed complications were discussed with the  patient. Return to normal activities tomorrow.                            Written discharge instructions were provided to the                            patient.                           - Resume previous high fiber diet.                           - Continue present medications.                           - Await pathology results.                           - Repeat colonoscopy for surveillance based on                            pathology results.                           - The findings and recommendations were discussed                            with the patient's family. Lynnie Bring, MD 01/04/2024 9:56:18 AM This report has been signed electronically.

## 2024-01-04 NOTE — Progress Notes (Signed)
 Called to room to assist during endoscopic procedure.  Patient ID and intended procedure confirmed with present staff. Received instructions for my participation in the procedure from the performing physician.

## 2024-01-04 NOTE — Patient Instructions (Signed)
 Resume previous high fiber diet.  Continue present medications.  Await pathology results.   YOU HAD AN ENDOSCOPIC PROCEDURE TODAY AT THE [facility]:   Refer to the procedure report that was given to you for any specific questions about what was found during the examination.  If the procedure report does not answer your questions, please call your gastroenterologist to clarify.  If you requested that your care partner not be given the details of your procedure findings, then the procedure report has been included in a sealed envelope for you to review at your convenience later.  YOU SHOULD EXPECT: Some feelings of bloating in the abdomen. Passage of more gas than usual.  Walking can help get rid of the air that was put into your GI tract during the procedure and reduce the bloating. If you had a lower endoscopy (such as a colonoscopy or flexible sigmoidoscopy) you may notice spotting of blood in your stool or on the toilet paper. If you underwent a bowel prep for your procedure, you may not have a normal bowel movement for a few days.  Please Note:  You might notice some irritation and congestion in your nose or some drainage.  This is from the oxygen used during your procedure.  There is no need for concern and it should clear up in a day or so.  SYMPTOMS TO REPORT IMMEDIATELY:  Following lower endoscopy (colonoscopy or flexible sigmoidoscopy):  Excessive amounts of blood in the stool  Significant tenderness or worsening of abdominal pains  Swelling of the abdomen that is new, acute  Fever of 100F or higher  For urgent or emergent issues, a gastroenterologist can be reached at any hour by calling [phone]. Do not use MyChart messaging for urgent concerns.    DIET:  We do recommend a small meal at first, but then you may proceed to your regular diet.  Drink plenty of fluids but you should avoid alcoholic beverages for 24 hours.  ACTIVITY:  You should plan to take it easy  for the rest of today and you should NOT DRIVE or use heavy machinery until tomorrow (because of the sedation medicines used during the test).    FOLLOW UP: Our staff will call the number listed on your records the next business day following your procedure.  We will call around 7:15- 8:00 am to check on you and address any questions or concerns that you may have regarding the information given to you following your procedure. If we do not reach you, we will leave a message.     If any biopsies were taken you will be contacted by phone or by letter within the next 1-3 weeks.  Please call us  at [phone] if you have not heard about the biopsies in 3 weeks.    SIGNATURES/CONFIDENTIALITY: You and/or your care partner have signed paperwork which will be entered into your electronic medical record.  These signatures attest to the fact that that the information above on your After Visit Summary has been reviewed and is understood.  Full responsibility of the confidentiality of this discharge information lies with you and/or your care-partner.

## 2024-01-04 NOTE — Progress Notes (Signed)
 Vss nad trans to pacu

## 2024-01-04 NOTE — Progress Notes (Signed)
 [facility] History and Physical   Primary Care Physician:  Sabas Norleen PARAS., MD   Reason for Procedure:   + cologuard  Plan:    colon     HPI: Peggy Freeman is a 63 y.o. female    Past Medical History:  Diagnosis Date   ADHD (attention deficit hyperactivity disorder)    Blood dyscrasia    Factor V Leiden mutation   Blood transfusion without reported diagnosis    Cancer (HCC)    endometrial   Dizziness    DVT (deep venous thrombosis) (HCC)    Factor V Leiden mutation    Stress incontinence     Past Surgical History:  Procedure Laterality Date   CESAREAN SECTION  1987, 1989, 1991   LAPAROSCOPIC GASTRIC SLEEVE RESECTION  2011   ROBOTIC ASSISTED TOTAL HYSTERECTOMY WITH BILATERAL SALPINGO OOPHERECTOMY Bilateral 11/06/2014   Procedure: ROBOTIC ASSISTED TOTAL HYSTERECTOMY WITH BILATERAL SALPINGO OOPHORECTOMY, SENTINEL LYMPH NODE BIOPSY, LYSIS OF ADHESIONS;  Surgeon: Maurilio Ship, MD;  Location: [facility];  Service: Gynecology;  Laterality: Bilateral;   TUBAL LIGATION  09/30/1989    Prior to Admission medications   Medication Sig Start Date End Date Taking? Authorizing Provider  ADDERALL  XR 30 MG 24 hr capsule Take 30 mg by mouth daily. 07/11/19  Yes [provider]  aspirin  81 MG tablet Take 81 mg by mouth daily.    Yes [provider]  Cholecalciferol (VITAMIN D3) 5000 UNITS CAPS Take 5,000 Units by mouth daily.   Yes [provider]  Cyanocobalamin ([employer] B-12) 1000 MCG/15ML LIQD Take 1 mL by mouth daily.   Yes [provider]  meloxicam (MOBIC) 15 MG tablet Take 15 mg by mouth daily. 11/27/23  Yes [provider]  Multiple Vitamins-Minerals (EMERGEN-C IMMUNE PLUS) PACK Take 1 Package by mouth daily.   Yes [provider]  polyethylene glycol powder (GLYCOLAX/MIRALAX) 17 GM/SCOOP powder Take 17 g by mouth daily. Dissolve 1 capful (17g) in 4-8 ounces of liquid and take by mouth daily.   Yes [provider]   buPROPion (WELLBUTRIN XL) 300 MG 24 hr tablet Take 300 mg by mouth at bedtime. Patient not taking: Reported on 01/04/2024 06/27/19   [provider]  furosemide (LASIX) 20 MG tablet furosemide 20 mg tablet  TAKE 1 TO 2 TABLETS BY MOUTH DAILY IN THE MORNING FOR SWELLING Patient not taking: Reported on 01/04/2024    [provider]  ibuprofen  (ADVIL ,MOTRIN ) 200 MG tablet Take 400 mg by mouth every 8 (eight) hours as needed for mild pain.  Patient not taking: Reported on 12/14/2023    [provider]  MOUNJARO 15 MG/0.5ML Pen SMARTSIG:0.5 Milliliter(s) SUB-Q Once a Week 11/29/23   [provider]    Current Outpatient Medications  Medication Sig Dispense Refill   ADDERALL  XR 30 MG 24 hr capsule Take 30 mg by mouth daily.     aspirin  81 MG tablet Take 81 mg by mouth daily.      Cholecalciferol (VITAMIN D3) 5000 UNITS CAPS Take 5,000 Units by mouth daily.     Cyanocobalamin ([employer] B-12) 1000 MCG/15ML LIQD Take 1 mL by mouth daily.     meloxicam (MOBIC) 15 MG tablet Take 15 mg by mouth daily.     Multiple Vitamins-Minerals (EMERGEN-C IMMUNE PLUS) PACK Take 1 Package by mouth daily.     polyethylene glycol powder (GLYCOLAX/MIRALAX) 17 GM/SCOOP powder Take 17 g by mouth daily. Dissolve 1 capful (17g) in 4-8 ounces of liquid and take by  mouth daily.     buPROPion (WELLBUTRIN XL) 300 MG 24 hr tablet Take 300 mg by mouth at bedtime. (Patient not taking: Reported on 01/04/2024)     furosemide (LASIX) 20 MG tablet furosemide 20 mg tablet  TAKE 1 TO 2 TABLETS BY MOUTH DAILY IN THE MORNING FOR SWELLING (Patient not taking: Reported on 01/04/2024)     ibuprofen  (ADVIL ,MOTRIN ) 200 MG tablet Take 400 mg by mouth every 8 (eight) hours as needed for mild pain.  (Patient not taking: Reported on 12/14/2023)     MOUNJARO 15 MG/0.5ML Pen SMARTSIG:0.5 Milliliter(s) SUB-Q Once a Week     No current facility-administered medications for this visit.    Allergies as of 01/04/2024    (No Known Allergies)    Family History  Problem Relation Age of Onset   Colon polyps Mother    Diabetes Mother    Hypertension Mother    Congestive Heart Failure Mother    Diabetes Brother    Heart disease Brother    Kidney failure Brother    Cancer Maternal Aunt    Diabetes Paternal Grandmother    Colon cancer Neg Hx    Esophageal cancer Neg Hx    Stomach cancer Neg Hx    Rectal cancer Neg Hx     Social History   Socioeconomic History   Marital status: Married    Spouse name: Not on file   Number of children: Not on file   Years of education: Not on file   Highest education level: Not on file  Occupational History   Not on file  Tobacco Use   Smoking status: Former    Current packs/day: 0.00    Types: Cigarettes    Quit date: 03/10/1995    Years since quitting: 28.8   Smokeless tobacco: Never  Vaping Use   Vaping status: Never Used  Substance and Sexual Activity   Alcohol use: No   Drug use: No   Sexual activity: Not on file  Other Topics Concern   Not on file  Social History Narrative   Not on file   Social Drivers of Health   Financial Resource Strain: Not on file  Food Insecurity: Not on file  Transportation Needs: Not on file  Physical Activity: Not on file  Stress: Not on file  Social Connections: Not on file  Intimate Partner Violence: Not on file    Review of Systems: Positive for none All other review of systems negative except as mentioned in the HPI.  Physical Exam: Vital signs in last 24 hours: @VSRANGES @   General:   Alert,  Well-developed, well-nourished, pleasant and cooperative in NAD Lungs:  Clear throughout to auscultation.   Heart:  Regular rate and rhythm; no murmurs, clicks, rubs,  or gallops. Abdomen:  Soft, nontender and nondistended. Normal bowel sounds.   Neuro/Psych:  Alert and cooperative. Normal mood and affect. A and O x 3    No significant changes were identified.  The patient continues to be an appropriate  candidate for the planned procedure and anesthesia.   Anselm Bring, MD. [facility] 01/04/2024 2:13 PM@

## 2024-01-05 ENCOUNTER — Telehealth: Payer: Self-pay

## 2024-01-05 NOTE — Telephone Encounter (Signed)
 No answer, left message to call if having any issues or concerns, B.Vester Titsworth RN

## 2024-01-06 LAB — SURGICAL PATHOLOGY

## 2024-01-09 ENCOUNTER — Ambulatory Visit: Payer: Self-pay | Admitting: Gastroenterology
